# Patient Record
Sex: Male | Born: 1955
Health system: Southern US, Community
[De-identification: ages and names within clinical notes are randomized; demographics above are authoritative.]

## PROBLEM LIST (undated history)

## (undated) DIAGNOSIS — I712 Thoracic aortic aneurysm, without rupture, unspecified: Secondary | ICD-10-CM

## (undated) DIAGNOSIS — E119 Type 2 diabetes mellitus without complications: Secondary | ICD-10-CM

## (undated) DIAGNOSIS — I10 Essential (primary) hypertension: Secondary | ICD-10-CM

## (undated) HISTORY — PX: OTHER SURGICAL HISTORY: SHX169

## (undated) HISTORY — DX: Essential (primary) hypertension: I10

## (undated) HISTORY — DX: Thoracic aortic aneurysm, without rupture, unspecified: I71.20

---

## 2016-02-23 ENCOUNTER — Encounter (HOSPITAL_COMMUNITY): Payer: Self-pay | Admitting: Emergency Medicine

## 2016-02-23 ENCOUNTER — Inpatient Hospital Stay (HOSPITAL_COMMUNITY): Payer: Managed Care, Other (non HMO)

## 2016-02-23 ENCOUNTER — Inpatient Hospital Stay (HOSPITAL_COMMUNITY)
Admission: EM | Admit: 2016-02-23 | Discharge: 2016-02-26 | DRG: 854 | Disposition: A | Discharge status: Home / Self Care | Payer: Managed Care, Other (non HMO) | Attending: Internal Medicine | Admitting: Internal Medicine

## 2016-02-23 DIAGNOSIS — E860 Dehydration: Secondary | ICD-10-CM | POA: Diagnosis present

## 2016-02-23 DIAGNOSIS — N179 Acute kidney failure, unspecified: Secondary | ICD-10-CM | POA: Diagnosis present

## 2016-02-23 DIAGNOSIS — K611 Rectal abscess: Secondary | ICD-10-CM | POA: Diagnosis present

## 2016-02-23 DIAGNOSIS — G4700 Insomnia, unspecified: Secondary | ICD-10-CM | POA: Diagnosis present

## 2016-02-23 DIAGNOSIS — K648 Other hemorrhoids: Secondary | ICD-10-CM | POA: Diagnosis present

## 2016-02-23 DIAGNOSIS — E119 Type 2 diabetes mellitus without complications: Secondary | ICD-10-CM | POA: Diagnosis not present

## 2016-02-23 DIAGNOSIS — A419 Sepsis, unspecified organism: Secondary | ICD-10-CM | POA: Diagnosis present

## 2016-02-23 DIAGNOSIS — D72829 Elevated white blood cell count, unspecified: Secondary | ICD-10-CM | POA: Diagnosis present

## 2016-02-23 DIAGNOSIS — E872 Acidosis, unspecified: Secondary | ICD-10-CM | POA: Diagnosis present

## 2016-02-23 DIAGNOSIS — Z833 Family history of diabetes mellitus: Secondary | ICD-10-CM

## 2016-02-23 DIAGNOSIS — K603 Anal fistula: Secondary | ICD-10-CM | POA: Diagnosis present

## 2016-02-23 DIAGNOSIS — K612 Anorectal abscess: Secondary | ICD-10-CM | POA: Diagnosis present

## 2016-02-23 DIAGNOSIS — R509 Fever, unspecified: Secondary | ICD-10-CM | POA: Diagnosis not present

## 2016-02-23 DIAGNOSIS — R000 Tachycardia, unspecified: Secondary | ICD-10-CM | POA: Diagnosis present

## 2016-02-23 DIAGNOSIS — E1165 Type 2 diabetes mellitus with hyperglycemia: Secondary | ICD-10-CM | POA: Diagnosis present

## 2016-02-23 DIAGNOSIS — R739 Hyperglycemia, unspecified: Secondary | ICD-10-CM | POA: Diagnosis present

## 2016-02-23 HISTORY — DX: Type 2 diabetes mellitus without complications: E11.9

## 2016-02-23 LAB — CBC WITH DIFFERENTIAL/PLATELET
BASOS PCT: 0 %
Basophils Absolute: 0 10*3/uL (ref 0.0–0.1)
EOS ABS: 0 10*3/uL (ref 0.0–0.7)
EOS PCT: 0 %
HCT: 40.4 % (ref 39.0–52.0)
Hemoglobin: 13.6 g/dL (ref 13.0–17.0)
LYMPHS ABS: 1.3 10*3/uL (ref 0.7–4.0)
Lymphocytes Relative: 9 %
MCH: 23.4 pg — AB (ref 26.0–34.0)
MCHC: 33.7 g/dL (ref 30.0–36.0)
MCV: 69.5 fL — AB (ref 78.0–100.0)
MONO ABS: 1.7 10*3/uL — AB (ref 0.1–1.0)
Monocytes Relative: 12 %
NEUTROS ABS: 10.9 10*3/uL — AB (ref 1.7–7.7)
NEUTROS PCT: 79 %
PLATELETS: 256 10*3/uL (ref 150–400)
RBC: 5.81 MIL/uL (ref 4.22–5.81)
RDW: 14.5 % (ref 11.5–15.5)
WBC: 13.9 10*3/uL — ABNORMAL HIGH (ref 4.0–10.5)

## 2016-02-23 LAB — GLUCOSE, CAPILLARY: Glucose-Capillary: 359 mg/dL — ABNORMAL HIGH (ref 65–99)

## 2016-02-23 LAB — TSH: TSH: 0.761 u[IU]/mL (ref 0.350–4.500)

## 2016-02-23 LAB — COMPREHENSIVE METABOLIC PANEL
ALT: 26 U/L (ref 17–63)
ANION GAP: 16 — AB (ref 5–15)
AST: 26 U/L (ref 15–41)
Albumin: 4.1 g/dL (ref 3.5–5.0)
Alkaline Phosphatase: 61 U/L (ref 38–126)
BUN: 13 mg/dL (ref 6–20)
CHLORIDE: 97 mmol/L — AB (ref 101–111)
CO2: 23 mmol/L (ref 22–32)
CREATININE: 1.33 mg/dL — AB (ref 0.61–1.24)
Calcium: 9.4 mg/dL (ref 8.9–10.3)
GFR, EST NON AFRICAN AMERICAN: 57 mL/min — AB (ref >60)
Glucose, Bld: 288 mg/dL — ABNORMAL HIGH (ref 65–99)
POTASSIUM: 4.4 mmol/L (ref 3.5–5.1)
SODIUM: 136 mmol/L (ref 135–145)
Total Bilirubin: 0.9 mg/dL (ref 0.3–1.2)
Total Protein: 8.6 g/dL — ABNORMAL HIGH (ref 6.5–8.1)

## 2016-02-23 LAB — LACTIC ACID, PLASMA: Lactic Acid, Venous: 1.3 mmol/L (ref 0.5–1.9)

## 2016-02-23 LAB — I-STAT CG4 LACTIC ACID, ED: LACTIC ACID, VENOUS: 3.42 mmol/L — AB (ref 0.5–1.9)

## 2016-02-23 LAB — CBG MONITORING, ED
Glucose-Capillary: 344 mg/dL — ABNORMAL HIGH (ref 65–99)
Glucose-Capillary: 244 mg/dL — ABNORMAL HIGH (ref 65–99)

## 2016-02-23 MED ORDER — MORPHINE SULFATE (PF) 4 MG/ML IV SOLN
4.0000 mg | Freq: Once | INTRAVENOUS | Status: AC
  Administered 2016-02-23: 4 mg via INTRAVENOUS
  Filled 2016-02-23: qty 1

## 2016-02-23 MED ORDER — SODIUM CHLORIDE 0.9 % IJ SOLN
INTRAMUSCULAR | Status: AC
  Filled 2016-02-23: qty 50

## 2016-02-23 MED ORDER — ONDANSETRON HCL 4 MG/2ML IJ SOLN
4.0000 mg | Freq: Four times a day (QID) | INTRAMUSCULAR | Status: DC | PRN
  Administered 2016-02-24: 4 mg via INTRAVENOUS

## 2016-02-23 MED ORDER — ONDANSETRON HCL 4 MG PO TABS: 4.0000 mg | ORAL_TABLET | Freq: Four times a day (QID) | ORAL | Status: DC | PRN

## 2016-02-23 MED ORDER — INSULIN GLARGINE 100 UNIT/ML ~~LOC~~ SOLN
15.0000 [IU] | Freq: Every day | SUBCUTANEOUS | Status: DC
  Administered 2016-02-23 – 2016-02-24 (×2): 15 [IU] via SUBCUTANEOUS
  Filled 2016-02-23 (×3): qty 0.15

## 2016-02-23 MED ORDER — IOPAMIDOL (ISOVUE-300) INJECTION 61%
INTRAVENOUS | Status: AC
  Filled 2016-02-23: qty 100

## 2016-02-23 MED ORDER — SENNOSIDES-DOCUSATE SODIUM 8.6-50 MG PO TABS
1.0000 | ORAL_TABLET | Freq: Two times a day (BID) | ORAL | Status: DC
  Administered 2016-02-23 – 2016-02-24 (×2): 1 via ORAL
  Filled 2016-02-23 (×6): qty 1

## 2016-02-23 MED ORDER — ACETAMINOPHEN 650 MG RE SUPP: 650.0000 mg | Freq: Four times a day (QID) | RECTAL | Status: DC | PRN

## 2016-02-23 MED ORDER — SODIUM CHLORIDE 0.9 % IV BOLUS (SEPSIS)
1000.0000 mL | Freq: Once | INTRAVENOUS | Status: AC
  Administered 2016-02-23: 1000 mL via INTRAVENOUS

## 2016-02-23 MED ORDER — ACETAMINOPHEN 325 MG PO TABS
650.0000 mg | ORAL_TABLET | Freq: Four times a day (QID) | ORAL | Status: DC | PRN
  Administered 2016-02-25: 650 mg via ORAL
  Filled 2016-02-23: qty 2

## 2016-02-23 MED ORDER — LIVING WELL WITH DIABETES BOOK
Freq: Once | Status: DC
  Filled 2016-02-23: qty 1

## 2016-02-23 MED ORDER — LIDOCAINE HCL 2 % IJ SOLN
20.0000 mL | Freq: Once | INTRAMUSCULAR | Status: AC
  Administered 2016-02-23: 400 mg via INTRADERMAL
  Filled 2016-02-23: qty 20

## 2016-02-23 MED ORDER — INSULIN ASPART 100 UNIT/ML ~~LOC~~ SOLN
4.0000 [IU] | Freq: Three times a day (TID) | SUBCUTANEOUS | Status: DC
  Administered 2016-02-24 – 2016-02-26 (×7): 4 [IU] via SUBCUTANEOUS

## 2016-02-23 MED ORDER — HYDROCODONE-ACETAMINOPHEN 5-325 MG PO TABS
1.0000 | ORAL_TABLET | ORAL | Status: DC | PRN
  Administered 2016-02-23 – 2016-02-24 (×2): 2 via ORAL
  Filled 2016-02-23 (×2): qty 2

## 2016-02-23 MED ORDER — INSULIN ASPART 100 UNIT/ML ~~LOC~~ SOLN
0.0000 [IU] | Freq: Every day | SUBCUTANEOUS | Status: DC
  Administered 2016-02-23: 5 [IU] via SUBCUTANEOUS
  Administered 2016-02-24: 2 [IU] via SUBCUTANEOUS

## 2016-02-23 MED ORDER — PIPERACILLIN-TAZOBACTAM 3.375 G IVPB
3.3750 g | Freq: Three times a day (TID) | INTRAVENOUS | Status: DC
  Administered 2016-02-24 – 2016-02-26 (×8): 3.375 g via INTRAVENOUS
  Filled 2016-02-23 (×7): qty 50

## 2016-02-23 MED ORDER — HEPARIN SODIUM (PORCINE) 5000 UNIT/ML IJ SOLN
5000.0000 [IU] | Freq: Three times a day (TID) | INTRAMUSCULAR | Status: DC
  Administered 2016-02-23 – 2016-02-26 (×6): 5000 [IU] via SUBCUTANEOUS
  Filled 2016-02-23 (×6): qty 1

## 2016-02-23 MED ORDER — PIPERACILLIN-TAZOBACTAM 3.375 G IVPB
3.3750 g | Freq: Once | INTRAVENOUS | Status: AC
  Administered 2016-02-23: 3.375 g via INTRAVENOUS
  Filled 2016-02-23: qty 50

## 2016-02-23 MED ORDER — ZOLPIDEM TARTRATE 5 MG PO TABS
5.0000 mg | ORAL_TABLET | Freq: Every evening | ORAL | Status: DC | PRN
Start: 2016-02-23 — End: 2016-02-26

## 2016-02-23 MED ORDER — POTASSIUM CHLORIDE IN NACL 20-0.9 MEQ/L-% IV SOLN
INTRAVENOUS | Status: DC
  Administered 2016-02-23 – 2016-02-24 (×2): via INTRAVENOUS
  Filled 2016-02-23 (×3): qty 1000

## 2016-02-23 MED ORDER — IOPAMIDOL (ISOVUE-300) INJECTION 61%
100.0000 mL | Freq: Once | INTRAVENOUS | Status: AC | PRN
Start: 2016-02-23 — End: 2016-02-23
  Administered 2016-02-23: 100 mL via INTRAVENOUS

## 2016-02-23 MED ORDER — INSULIN ASPART 100 UNIT/ML ~~LOC~~ SOLN
0.0000 [IU] | Freq: Three times a day (TID) | SUBCUTANEOUS | Status: DC
  Administered 2016-02-24: 5 [IU] via SUBCUTANEOUS
  Administered 2016-02-24 – 2016-02-26 (×6): 3 [IU] via SUBCUTANEOUS

## 2016-02-23 NOTE — ED Triage Notes (Signed)
Pt was seen at UC this am and sent for further evaluation to diagnose perirectal abscess. Pt c/o swelling and pain to rectal area. Pt was also told his blood sugar was high, CBG of 291.

## 2016-02-23 NOTE — ED Provider Notes (Signed)
[facility]-EMERGENCY DEPT Provider Note   CSN: [id number] Arrival date & time: 02/23/16  1230     History   Chief Complaint Chief Complaint  Patient presents with  . Abscess  . Hyperglycemia    HPI Paul Nichols is a 61 y.o. male.  The history is provided by the patient. No language interpreter was used.  Abscess  Hyperglycemia    Paul Nichols is a 61 y.o. male who presents to the Emergency Department complaining of abscess.  Paul Nichols presents from urgent care for evaluation of rectal pain and hyperglycemia. He reports 1 week of URI symptoms with cough, runny nose, nasal congestion. He was starting to improve from those symptoms when he began to feel poorly over the last several days with malaise, body aches and rectal pain. He went to urgent care today and was noted to be hyperglycemic and was referred to the emergency department for further evaluation. He denies any vomiting, diarrhea, constipation, abdominal pain, dysuria. He has no known history of diabetes and last ate yesterday. He had a fever to 101 today at urgent care.  History reviewed. No pertinent past medical history.  There are no active problems to display for this patient.   History reviewed. No pertinent surgical history.     Home Medications    Prior to Admission medications   Medication Sig Start Date End Date Taking? Authorizing Provider  Pseudoeph-Doxylamine-DM-APAP (NYQUIL PO) Take 30 mLs by mouth at bedtime as needed (cold symptoms).   Yes Historical Provider, MD    Family History No family history on file.  Social History Social History  Substance Use Topics  . Smoking status: Never Smoker  . Smokeless tobacco: Not on file  . Alcohol use Yes     Comment: Social     Allergies   Patient has no known allergies.   Review of Systems Review of Systems  All other systems reviewed and are negative.    Physical Exam Updated Vital Signs BP 120/77 (BP Location: Left Arm)   Pulse 114   Temp  100.2 F (37.9 C) (Oral)   Resp 16   Ht 5\' 10"  (1.778 m)   Wt 211 lb 6.4 oz (95.9 kg)   SpO2 98%   BMI 30.33 kg/m   Physical Exam  Constitutional: He is oriented to person, place, and time. He appears well-developed and well-nourished.  HENT:  Head: Normocephalic and atraumatic.  Cardiovascular: Regular rhythm.   No murmur heard. Tachycardic  Pulmonary/Chest: Effort normal and breath sounds normal. No respiratory distress.  Abdominal: Soft. There is no tenderness. There is no rebound and no guarding.  Genitourinary:  Genitourinary Comments: Approximately 1 cm area of tenderness and induration in the right perirectal region.  Musculoskeletal: He exhibits no edema or tenderness.  Neurological: He is alert and oriented to person, place, and time.  Skin: Skin is warm and dry.  Psychiatric: He has a normal mood and affect. His behavior is normal.  Nursing note and vitals reviewed.    ED Treatments / Results  Labs (all labs ordered are listed, but only abnormal results are displayed) Labs Reviewed  CBC WITH DIFFERENTIAL/PLATELET - Abnormal; Notable for the following:       Result Value   WBC 13.9 (*)    MCV 69.5 (*)    MCH 23.4 (*)    Neutro Abs 10.9 (*)    Monocytes Absolute 1.7 (*)    All other components within normal limits  COMPREHENSIVE METABOLIC PANEL - Abnormal; Notable for  the following:    Chloride 97 (*)    Glucose, Bld 288 (*)    Creatinine, Ser 1.33 (*)    Total Protein 8.6 (*)    GFR calc non Af Amer 57 (*)    Anion gap 16 (*)    All other components within normal limits  CBG MONITORING, ED - Abnormal; Notable for the following:    Glucose-Capillary 344 (*)    All other components within normal limits  I-STAT CG4 LACTIC ACID, ED - Abnormal; Notable for the following:    Lactic Acid, Venous 3.42 (*)    All other components within normal limits  URINALYSIS, ROUTINE W REFLEX MICROSCOPIC    EKG  EKG Interpretation None       Radiology No results  found.  Procedures Procedures (including critical care time)  Medications Ordered in ED Medications  sodium chloride 0.9 % bolus 1,000 mL (not administered)  morphine 4 MG/ML injection 4 mg (4 mg Intravenous Given 02/23/16 1621)  sodium chloride 0.9 % bolus 1,000 mL (1,000 mLs Intravenous New Bag/Given 02/23/16 1621)     Initial Impression / Assessment and Plan / ED Course  I have reviewed the triage vital signs and the nursing notes.  Pertinent labs & imaging results that were available during my care of the patient were reviewed by me and considered in my medical decision making (see chart for details).     Patient here with perirectal pain and fever. He has a small perirectal abscess on examination. Gen. surgery consulted for further management. Patient updated of findings and studies.  Surgery has evaluated the patient in the ED - plan for bedside I and D.  Will start on abx.  Hospitalist consulted for admission.    Final Clinical Impressions(s) / ED Diagnoses   Final diagnoses:  Perirectal abscess    New Prescriptions New Prescriptions   No medications on file     Tilden FossaElizabeth Kadija Cruzen, MD 02/24/16 [phone]

## 2016-02-23 NOTE — Progress Notes (Signed)
Pharmacy Antibiotic Note  Paul Nichols is a 61 y.o. male admitted on 02/23/2016 with sepsis and perirectal abscess.  Pharmacy has been consulted for zosyn dosing.  Plan: Zosyn 3.375g IV q8h (4 hour infusion time).   Height: 5\' 10"  (177.8 cm) Weight: 211 lb (95.7 kg) IBW/kg (Calculated) : 73  Temp (24hrs), Avg:100 F (37.8 C), Min:99.7 F (37.6 C), Max:100.2 F (37.9 C)   Recent Labs Lab 02/23/16 1602 02/23/16 1613  WBC 13.9*  --   CREATININE 1.33*  --   LATICACIDVEN  --  3.42*    Estimated Creatinine Clearance: 70.3 mL/min (by C-G formula based on SCr of 1.33 mg/dL (H)).    No Known Allergies  Antimicrobials this admission: 2/15 Zosyn >>   Dose adjustments this admission:  Microbiology results: 2/15 BCx: sent 2/15 UCx: sent   Thank you for allowing pharmacy to be a part of this patient's care.  Clance BollRunyon, Jiovani Mccammon 02/23/2016 6:07 PM

## 2016-02-23 NOTE — Consult Note (Signed)
Reason for Consult:  Perirectal abscess with elevated glucose Referring Physician: Dr. Rudean Haskell NO PCP  Paul Nichols is an 61 y.o. male.  HPI: Pt presented to an Urgent Care this AM with rectal pain.  Found to have a fever 101, and elevated glucose.  He note symptoms started about 2 day ago.  No history of diabetes, no meds at home before this.  Mother and father have diabetes  History reviewed. No pertinent past medical history.  History reviewed. No pertinent surgical history.   Right thumb surgery, torn ligament  No family history on file.  Social History:  reports that he has never smoked. He does not have any smokeless tobacco history on file. He reports that he drinks alcohol. His drug history is not on file.  Allergies: No Known Allergies  Prior to Admission medications   none     Results for orders placed or performed during the hospital encounter of 02/23/16 (from the past 48 hour(s))  CBG monitoring, ED     Status: Abnormal   Collection Time: 02/23/16  1:05 PM  Result Value Ref Range   Glucose-Capillary 344 (H) 65 - 99 mg/dL  CBC with Differential     Status: Abnormal   Collection Time: 02/23/16  4:02 PM  Result Value Ref Range   WBC 13.9 (H) 4.0 - 10.5 K/uL   RBC 5.81 4.22 - 5.81 MIL/uL   Hemoglobin 13.6 13.0 - 17.0 g/dL   HCT 40.4 39.0 - 52.0 %   MCV 69.5 (L) 78.0 - 100.0 fL   MCH 23.4 (L) 26.0 - 34.0 pg   MCHC 33.7 30.0 - 36.0 g/dL   RDW 14.5 11.5 - 15.5 %   Platelets 256 150 - 400 K/uL   Neutrophils Relative % 79 %   Lymphocytes Relative 9 %   Monocytes Relative 12 %   Eosinophils Relative 0 %   Basophils Relative 0 %   Neutro Abs 10.9 (H) 1.7 - 7.7 K/uL   Lymphs Abs 1.3 0.7 - 4.0 K/uL   Monocytes Absolute 1.7 (H) 0.1 - 1.0 K/uL   Eosinophils Absolute 0.0 0.0 - 0.7 K/uL   Basophils Absolute 0.0 0.0 - 0.1 K/uL   Smear Review LARGE PLATELETS PRESENT   Comprehensive metabolic panel     Status: Abnormal   Collection Time: 02/23/16  4:02 PM  Result Value  Ref Range   Sodium 136 135 - 145 mmol/L   Potassium 4.4 3.5 - 5.1 mmol/L   Chloride 97 (L) 101 - 111 mmol/L   CO2 23 22 - 32 mmol/L   Glucose, Bld 288 (H) 65 - 99 mg/dL   BUN 13 6 - 20 mg/dL   Creatinine, Ser 1.33 (H) 0.61 - 1.24 mg/dL   Calcium 9.4 8.9 - 10.3 mg/dL   Total Protein 8.6 (H) 6.5 - 8.1 g/dL   Albumin 4.1 3.5 - 5.0 g/dL   AST 26 15 - 41 U/L   ALT 26 17 - 63 U/L   Alkaline Phosphatase 61 38 - 126 U/L   Total Bilirubin 0.9 0.3 - 1.2 mg/dL   GFR calc non Af Amer 57 (L) >60 mL/min   GFR calc Af Amer >60 >60 mL/min    Comment: (NOTE) The eGFR has been calculated using the CKD EPI equation. This calculation has not been validated in all clinical situations. eGFR's persistently <60 mL/min signify possible Chronic Kidney Disease.    Anion gap 16 (H) 5 - 15  I-Stat CG4 Lactic Acid, ED  Status: Abnormal   Collection Time: 02/23/16  4:13 PM  Result Value Ref Range   Lactic Acid, Venous 3.42 (HH) 0.5 - 1.9 mmol/L   Comment NOTIFIED PHYSICIAN     No results found.  Review of Systems  Constitutional: Positive for chills and fever (fever at home and currently 102). Negative for weight loss.  HENT: Negative.   Eyes: Negative.   Respiratory: Negative.   Gastrointestinal: Negative.   Genitourinary: Negative.   Musculoskeletal: Negative.   Skin: Negative.   Neurological: Negative.   Endo/Heme/Allergies: Negative.   Psychiatric/Behavioral: Negative.    Blood pressure 120/77, pulse 114, temperature 100.2 F (37.9 C), temperature source Oral, resp. rate 16, height _0  (1.778 m), weight 95.9 kg (211 lb 6.4 oz), SpO2 98 %. Physical Exam  Constitutional: He is oriented to person, place, and time. He appears well-developed and well-nourished. He appears distressed.  HENT:  Head: Normocephalic and atraumatic.  Mouth/Throat: Oropharyngeal exudate present.  Eyes: Right eye exhibits discharge. Left eye exhibits discharge.  Neck: No JVD present. No tracheal deviation  present. No thyromegaly present.  Cardiovascular: Normal rate, regular rhythm, normal heart sounds and intact distal pulses.   No murmur heard. Respiratory: Effort normal and breath sounds normal. No respiratory distress. He has no wheezes. He has no rales. He exhibits no tenderness.  GI: Soft. Bowel sounds are normal. He exhibits no distension and no mass. There is no tenderness. There is no rebound and no guarding.  Genitourinary:  Genitourinary Comments:  Tenderness and pain Left upper  position of rectum around the anus  Musculoskeletal: He exhibits no edema or tenderness.  Lymphadenopathy:    He has no cervical adenopathy.  Neurological: He is alert and oriented to person, place, and time. No cranial nerve deficit.  Skin: Skin is warm and dry. No rash noted. He is not diaphoretic. There is erythema. No pallor.  Psychiatric: He has a normal mood and affect. His behavior is normal. Judgment and thought content normal.    Assessment/Plan: Perirectal abscess Sepsis Probable diabetes Lactic acidosis  Plan:  I&D in ED and Medical admit for sepsis and new Diabetes.We did not find anything to explain his fever and pain.  Will also add a CT scan tonight.  Recommend we start Zosyn.    [name] 02/23/2016, 5:10 PM

## 2016-02-23 NOTE — H&P (Signed)
History and Physical  Paul Nichols ZOX:[id number] DOB: [birth year] DOA: 02/23/2016  Referring physician: Madilyn Hookees, MD PCP: No PCP Per Patient   Chief Complaint: abscess  HPI: Paul Nichols is a 61 y.o. male with no known PMH presented to the Emergency Department complaining of abscess.  He was sent from a local urgent care facility with fever.   Paul Nichols presented from urgent care for evaluation of rectal pain and hyperglycemia.  His blood glucose was noted to be greater than 300.   He reports 1 week of URI symptoms with cough, runny nose, nasal congestion.  He also reports polyuria and polydipsia in past 24 hours.  His wife says that he has been sweating a lot over past several days and he has been having fever and chills.  He was starting to improve from those symptoms when he began to feel poorly over the last several days with malaise, body aches and rectal pain. He went to urgent care today and was noted to be hyperglycemic and was referred to the emergency department for further evaluation. He denies any vomiting, diarrhea, constipation, abdominal pain, dysuria. He has no known history of diabetes and last ate yesterday. He had a fever to 101 today at urgent care.  He was seen in ED and noted to be tachycardic with fever and blood sugar of 300.  He had a large peri-rectal abscess that was I&D'd in ED by general surgery.  He had sepsis physiology.  He was noted to have an elevated lactic acid and also noted to have leukocytosis.  CODE SEPSIS was called and admission was requested for further management.     Review of Systems: All systems reviewed and apart from history of presenting illness, are negative.  Past Medical History:  Diagnosis Date  . Diabetes mellitus without complication [facility](HCC)    Past Surgical History:  Procedure Laterality Date  . thumb surgery     in high school , hx injury playing football   Social History:  reports that he has never smoked. He has never used smokeless  tobacco. He reports that he drinks alcohol. He reports that he does not use drugs.  No Known Allergies  Family History  Problem Relation Age of Onset  . Diabetes Mother   . Diabetes Father      Prior to Admission medications   Medication Sig Start Date End Date Taking? Authorizing Provider  Pseudoeph-Doxylamine-DM-APAP (NYQUIL PO) Take 30 mLs by mouth at bedtime as needed (cold symptoms).   Yes Historical Provider, MD   Physical Exam: Vitals:   02/23/16 1235 02/23/16 1505 02/23/16 1621 02/23/16 1745  BP: 133/97 120/77    Pulse: 113 119 114   Resp: 16 16    Temp: 99.7 F (37.6 C) 100.2 F (37.9 C)    TempSrc: Oral Oral    SpO2: 97% 100% 98%   Weight: 95.9 kg (211 lb 6.4 oz)   95.7 kg (211 lb)  Height: 5\' 10"  (1.778 m)   5\' 10"  (1.778 m)     General exam: Moderately built and nourished patient, lying comfortably supine on the gurney in no obvious distress.  Head, eyes and ENT: Nontraumatic and normocephalic. Pupils equally reacting to light and accommodation. Oral mucosa very dry.   Neck: Supple. No JVD, carotid bruit or thyromegaly.   Lymphatics: No lymphadenopathy.  Respiratory system: Clear to auscultation. No increased work of breathing.  Cardiovascular system: S1 and S2 heard, tachycardic. No JVD, murmurs, gallops, clicks or pedal edema.  Gastrointestinal system: Abdomen is nondistended, soft and nontender. Normal bowel sounds heard. No organomegaly or masses appreciated.  GU: Tenderness and pain Left upper  position of rectum around the anus.   Central nervous system: Alert and oriented. No focal neurological deficits.  Extremities: Symmetric 5 x 5 power. Peripheral pulses symmetrically felt.   Skin: No rashes or acute findings.  Musculoskeletal system: Negative exam.  Psychiatry: Pleasant and cooperative.   Labs on Admission:  Basic Metabolic Panel:  Recent Labs Lab 02/23/16 1602  NA 136  K 4.4  CL 97*  CO2 23  GLUCOSE 288*  BUN 13  CREATININE  1.33*  CALCIUM 9.4   Liver Function Tests:  Recent Labs Lab 02/23/16 1602  AST 26  ALT 26  ALKPHOS 61  BILITOT 0.9  PROT 8.6*  ALBUMIN 4.1   No results for input(s): LIPASE, AMYLASE in the last 168 hours. No results for input(s): AMMONIA in the last 168 hours. CBC:  Recent Labs Lab 02/23/16 1602  WBC 13.9*  NEUTROABS 10.9*  HGB 13.6  HCT 40.4  MCV 69.5*  PLT 256   Cardiac Enzymes: No results for input(s): CKTOTAL, CKMB, CKMBINDEX, TROPONINI in the last 168 hours.  BNP (last 3 results) No results for input(s): PROBNP in the last 8760 hours. CBG:  Recent Labs Lab 02/23/16 1305  GLUCAP 344*    Radiological Exams on Admission: No results found.  EKG: Independently reviewed. Sinus tachycardia  Assessment/Plan Active Problems:   Sepsis (HCC)   Type 2 diabetes mellitus (HCC)   Hyperglycemia   Dehydration   AKI (acute kidney injury) (HCC)   Fever   Perirectal abscess   Leukocytosis   Lactic acid acidosis   Sinus tachycardia   1. Sepsis secondary to peri-rectal abscess - Continue Code Sepsis, IVF hydration after initial boluses, Follow lactate level, repeat within 6 hours, follow blood cultures, check urinalysis, continue IV zosyn per pharmacy, I&D of abscess (needle aspiration) done in ED by general surgery. They are getting a CT scan to further investigate causes for fever and sepsis.   2. New diagnosis of type 2 DM - check A1c, Lantus and novolog ordered, diabetes education to be started, consult DM coordinators.  Sliding scale coverage ordered and check CBG 5 times per day.  Carb modified diet.  Needs to watch diabetes videos.  Pt needs to get established with a PCP.  His wife says she will try to get him in to her clinic with [facility].  Consult care manager to assist with primary care provider.   3. Leukocytosis - secondary to sepsis and infection, treating as above.  4. Lactic Acidosis - Aggressive fluid hydration per Code Sepsis  protocol, follow lactate levels, repeat ordered within 6 hours.  5. Sinus tachycardia - secondary to fever, sepsis, dehydration, infection - treating as above.  6. Acute Kidney Injury - suspect prerenal secondary to dehydration, IVF hydration as ordered and repeat BMP in AM.   7. Fever - tylenol ordered as needed.     DVT Prophylaxis: heparin Code Status: full   Family Communication: wife  Disposition Plan: home    Time spent: 40 mins  Standley Dakins, MD [facility] Pager [phone]  If 7PM-7AM, please contact night-coverage [email or web address] Password [facility] 02/23/2016, 5:56 PM

## 2016-02-24 ENCOUNTER — Inpatient Hospital Stay (HOSPITAL_COMMUNITY): Payer: Managed Care, Other (non HMO) | Admitting: Anesthesiology

## 2016-02-24 ENCOUNTER — Encounter (HOSPITAL_COMMUNITY)
Admission: EM | Disposition: A | Discharge status: Home / Self Care | Payer: Self-pay | Source: Home / Self Care | Attending: Internal Medicine

## 2016-02-24 ENCOUNTER — Encounter (HOSPITAL_COMMUNITY): Payer: Self-pay | Admitting: Anesthesiology

## 2016-02-24 DIAGNOSIS — N179 Acute kidney failure, unspecified: Secondary | ICD-10-CM

## 2016-02-24 DIAGNOSIS — K611 Rectal abscess: Secondary | ICD-10-CM

## 2016-02-24 DIAGNOSIS — E119 Type 2 diabetes mellitus without complications: Secondary | ICD-10-CM

## 2016-02-24 DIAGNOSIS — A419 Sepsis, unspecified organism: Principal | ICD-10-CM

## 2016-02-24 HISTORY — PX: INCISION AND DRAINAGE PERIRECTAL ABSCESS: SHX1804

## 2016-02-24 LAB — URINALYSIS, ROUTINE W REFLEX MICROSCOPIC
Bacteria, UA: NONE SEEN
Bilirubin Urine: NEGATIVE
HGB URINE DIPSTICK: NEGATIVE
KETONES UR: 20 mg/dL — AB
LEUKOCYTES UA: NEGATIVE
NITRITE: NEGATIVE
PH: 5 (ref 5.0–8.0)
Protein, ur: 30 mg/dL — AB
Specific Gravity, Urine: 1.032 — ABNORMAL HIGH (ref 1.005–1.030)
Squamous Epithelial / LPF: NONE SEEN

## 2016-02-24 LAB — BLOOD CULTURE ID PANEL (REFLEXED)
ACINETOBACTER BAUMANNII: NOT DETECTED
CANDIDA ALBICANS: NOT DETECTED
CANDIDA GLABRATA: NOT DETECTED
CANDIDA KRUSEI: NOT DETECTED
CANDIDA PARAPSILOSIS: NOT DETECTED
CANDIDA TROPICALIS: NOT DETECTED
ENTEROBACTERIACEAE SPECIES: NOT DETECTED
ESCHERICHIA COLI: NOT DETECTED
Enterobacter cloacae complex: NOT DETECTED
Enterococcus species: NOT DETECTED
HAEMOPHILUS INFLUENZAE: NOT DETECTED
KLEBSIELLA OXYTOCA: NOT DETECTED
KLEBSIELLA PNEUMONIAE: NOT DETECTED
Listeria monocytogenes: NOT DETECTED
Methicillin resistance: NOT DETECTED
Neisseria meningitidis: NOT DETECTED
PSEUDOMONAS AERUGINOSA: NOT DETECTED
Proteus species: NOT DETECTED
STREPTOCOCCUS PNEUMONIAE: NOT DETECTED
STREPTOCOCCUS PYOGENES: NOT DETECTED
Serratia marcescens: NOT DETECTED
Staphylococcus aureus (BCID): NOT DETECTED
Staphylococcus species: DETECTED — AB
Streptococcus agalactiae: NOT DETECTED
Streptococcus species: DETECTED — AB

## 2016-02-24 LAB — COMPREHENSIVE METABOLIC PANEL
ALK PHOS: 52 U/L (ref 38–126)
ALT: 22 U/L (ref 17–63)
ANION GAP: 8 (ref 5–15)
AST: 16 U/L (ref 15–41)
Albumin: 3.3 g/dL — ABNORMAL LOW (ref 3.5–5.0)
BILIRUBIN TOTAL: 0.9 mg/dL (ref 0.3–1.2)
BUN: 11 mg/dL (ref 6–20)
CO2: 24 mmol/L (ref 22–32)
CREATININE: 1.02 mg/dL (ref 0.61–1.24)
Calcium: 8.7 mg/dL — ABNORMAL LOW (ref 8.9–10.3)
Chloride: 105 mmol/L (ref 101–111)
Glucose, Bld: 244 mg/dL — ABNORMAL HIGH (ref 65–99)
Potassium: 3.8 mmol/L (ref 3.5–5.1)
Sodium: 137 mmol/L (ref 135–145)
Total Protein: 7.3 g/dL (ref 6.5–8.1)

## 2016-02-24 LAB — CBC WITH DIFFERENTIAL/PLATELET
BASOS PCT: 0 %
Basophils Absolute: 0 10*3/uL (ref 0.0–0.1)
Eosinophils Absolute: 0 10*3/uL (ref 0.0–0.7)
Eosinophils Relative: 0 %
HEMATOCRIT: 36.8 % — AB (ref 39.0–52.0)
Hemoglobin: 12.3 g/dL — ABNORMAL LOW (ref 13.0–17.0)
LYMPHS ABS: 1.7 10*3/uL (ref 0.7–4.0)
Lymphocytes Relative: 12 %
MCH: 23.3 pg — AB (ref 26.0–34.0)
MCHC: 33.4 g/dL (ref 30.0–36.0)
MCV: 69.8 fL — AB (ref 78.0–100.0)
MONOS PCT: 11 %
Monocytes Absolute: 1.6 10*3/uL — ABNORMAL HIGH (ref 0.1–1.0)
Neutro Abs: 11.1 10*3/uL — ABNORMAL HIGH (ref 1.7–7.7)
Neutrophils Relative %: 77 %
PLATELETS: 243 10*3/uL (ref 150–400)
RBC: 5.27 MIL/uL (ref 4.22–5.81)
RDW: 14.4 % (ref 11.5–15.5)
WBC: 14.4 10*3/uL — AB (ref 4.0–10.5)

## 2016-02-24 LAB — GLUCOSE, CAPILLARY
GLUCOSE-CAPILLARY: 247 mg/dL — AB (ref 65–99)
GLUCOSE-CAPILLARY: 168 mg/dL — AB (ref 65–99)
GLUCOSE-CAPILLARY: 231 mg/dL — AB (ref 65–99)
Glucose-Capillary: 244 mg/dL — ABNORMAL HIGH (ref 65–99)
Glucose-Capillary: 147 mg/dL — ABNORMAL HIGH (ref 65–99)
Glucose-Capillary: 187 mg/dL — ABNORMAL HIGH (ref 65–99)

## 2016-02-24 LAB — MRSA PCR SCREENING: MRSA BY PCR: NEGATIVE

## 2016-02-24 SURGERY — INCISION AND DRAINAGE, ABSCESS, PERIRECTAL
Anesthesia: General | Site: Perineum

## 2016-02-24 MED ORDER — MIDAZOLAM HCL 5 MG/5ML IJ SOLN
INTRAMUSCULAR | Status: DC | PRN
  Administered 2016-02-24: 2 mg via INTRAVENOUS

## 2016-02-24 MED ORDER — MUPIROCIN 2 % EX OINT
TOPICAL_OINTMENT | CUTANEOUS | Status: AC
  Administered 2016-02-24: 11:00:00
  Filled 2016-02-24: qty 22

## 2016-02-24 MED ORDER — FENTANYL CITRATE (PF) 100 MCG/2ML IJ SOLN: 25.0000 ug | INTRAMUSCULAR | Status: DC | PRN

## 2016-02-24 MED ORDER — BUPIVACAINE HCL (PF) 0.5 % IJ SOLN
INTRAMUSCULAR | Status: AC
  Filled 2016-02-24: qty 30

## 2016-02-24 MED ORDER — ACETAMINOPHEN 160 MG/5ML PO SOLN
1000.0000 mg | Freq: Once | ORAL | Status: AC
  Administered 2016-02-24: 350 mg via ORAL
  Administered 2016-02-24: 649.6 mg via ORAL

## 2016-02-24 MED ORDER — FENTANYL CITRATE (PF) 100 MCG/2ML IJ SOLN
INTRAMUSCULAR | Status: DC | PRN
  Administered 2016-02-24 (×7): 50 ug via INTRAVENOUS

## 2016-02-24 MED ORDER — HYDROMORPHONE HCL 1 MG/ML IJ SOLN
INTRAMUSCULAR | Status: AC
  Filled 2016-02-24: qty 1

## 2016-02-24 MED ORDER — LACTATED RINGERS IV SOLN
INTRAVENOUS | Status: DC | PRN
  Administered 2016-02-24: 12:00:00 via INTRAVENOUS

## 2016-02-24 MED ORDER — FENTANYL CITRATE (PF) 250 MCG/5ML IJ SOLN
INTRAMUSCULAR | Status: AC
  Filled 2016-02-24: qty 5

## 2016-02-24 MED ORDER — MIDAZOLAM HCL 2 MG/2ML IJ SOLN
INTRAMUSCULAR | Status: AC
  Filled 2016-02-24: qty 2

## 2016-02-24 MED ORDER — LIDOCAINE HCL (CARDIAC) 20 MG/ML IV SOLN
INTRAVENOUS | Status: DC | PRN
  Administered 2016-02-24: 100 mg via INTRAVENOUS

## 2016-02-24 MED ORDER — SODIUM CHLORIDE 0.45 % IV SOLN
INTRAVENOUS | Status: DC
  Administered 2016-02-24: 15:00:00 via INTRAVENOUS

## 2016-02-24 MED ORDER — SODIUM CHLORIDE 0.9 % IR SOLN
Status: DC | PRN
  Administered 2016-02-24: 1000 mL

## 2016-02-24 MED ORDER — FENTANYL CITRATE (PF) 100 MCG/2ML IJ SOLN
INTRAMUSCULAR | Status: AC
  Filled 2016-02-24: qty 2

## 2016-02-24 MED ORDER — PROPOFOL 10 MG/ML IV BOLUS
INTRAVENOUS | Status: AC
  Filled 2016-02-24: qty 20

## 2016-02-24 MED ORDER — PROPOFOL 10 MG/ML IV BOLUS
INTRAVENOUS | Status: DC | PRN
  Administered 2016-02-24: 200 mg via INTRAVENOUS

## 2016-02-24 MED ORDER — LIVING WELL WITH DIABETES BOOK
Freq: Once | Status: AC
  Administered 2016-02-24: 14:00:00
  Filled 2016-02-24: qty 1

## 2016-02-24 MED ORDER — HYDROMORPHONE HCL 1 MG/ML IJ SOLN
0.2500 mg | INTRAMUSCULAR | Status: DC | PRN
  Administered 2016-02-24 (×4): 0.5 mg via INTRAVENOUS

## 2016-02-24 MED ORDER — BUPIVACAINE LIPOSOME 1.3 % IJ SUSP
20.0000 mL | Freq: Once | INTRAMUSCULAR | Status: AC
  Administered 2016-02-24: 20 mL
  Filled 2016-02-24: qty 20

## 2016-02-24 MED ORDER — ACETAMINOPHEN 160 MG/5ML PO SOLN
ORAL | Status: AC
  Filled 2016-02-24: qty 40.6

## 2016-02-24 SURGICAL SUPPLY — 35 items
BLADE HEX COATED 2.75 (ELECTRODE) ×3 IMPLANT
BLADE SURG 15 STRL LF DISP TIS (BLADE) ×1 IMPLANT
BLADE SURG 15 STRL SS (BLADE) ×2
COVER SURGICAL LIGHT HANDLE (MISCELLANEOUS) ×3 IMPLANT
DRAIN PENROSE 18X1/2 LTX STRL (DRAIN) IMPLANT
DRAIN PENROSE 18X1/4 LTX STRL (WOUND CARE) IMPLANT
DRAPE LAPAROTOMY T 102X78X121 (DRAPES) ×3 IMPLANT
DRSG PAD ABDOMINAL 8X10 ST (GAUZE/BANDAGES/DRESSINGS) ×3 IMPLANT
ELECT PENCIL ROCKER SW 15FT (MISCELLANEOUS) ×3 IMPLANT
ELECT REM PT RETURN 9FT ADLT (ELECTROSURGICAL) ×3
ELECTRODE REM PT RTRN 9FT ADLT (ELECTROSURGICAL) ×1 IMPLANT
GAUZE SPONGE 4X4 12PLY STRL (GAUZE/BANDAGES/DRESSINGS) ×3 IMPLANT
GAUZE SPONGE 4X4 16PLY XRAY LF (GAUZE/BANDAGES/DRESSINGS) ×3 IMPLANT
GLOVE BIO SURGEON STRL SZ7 (GLOVE) ×3 IMPLANT
GLOVE BIOGEL PI IND STRL 7.0 (GLOVE) ×1 IMPLANT
GLOVE BIOGEL PI INDICATOR 7.0 (GLOVE) ×2
GLOVE ECLIPSE 8.0 STRL XLNG CF (GLOVE) ×3 IMPLANT
GLOVE INDICATOR 8.0 STRL GRN (GLOVE) ×3 IMPLANT
GOWN STRL REUS W/TWL XL LVL3 (GOWN DISPOSABLE) ×6 IMPLANT
KIT BASIN OR (CUSTOM PROCEDURE TRAY) ×3 IMPLANT
LUBRICANT JELLY K Y 4OZ (MISCELLANEOUS) ×6 IMPLANT
MARKER SKIN DUAL TIP RULER LAB (MISCELLANEOUS) ×3 IMPLANT
NDL SAFETY ECLIPSE 18X1.5 (NEEDLE) ×1 IMPLANT
NEEDLE HYPO 18GX1.5 SHARP (NEEDLE) ×2
NEEDLE HYPO 22GX1.5 SAFETY (NEEDLE) ×3 IMPLANT
NEEDLE HYPO 25X1 1.5 SAFETY (NEEDLE) ×3 IMPLANT
PACK LITHOTOMY IV (CUSTOM PROCEDURE TRAY) ×3 IMPLANT
PAD ABD 8X10 STRL (GAUZE/BANDAGES/DRESSINGS) ×3 IMPLANT
SPONGE SURGIFOAM ABS GEL 12-7 (HEMOSTASIS) ×3 IMPLANT
SUT CHROMIC 3 0 SH 27 (SUTURE) IMPLANT
SYR 20CC LL (SYRINGE) ×3 IMPLANT
SYR CONTROL 10ML LL (SYRINGE) ×3 IMPLANT
TOWEL OR 17X26 10 PK STRL BLUE (TOWEL DISPOSABLE) ×3 IMPLANT
TOWEL OR NON WOVEN STRL DISP B (DISPOSABLE) IMPLANT
YANKAUER SUCT BULB TIP 10FT TU (MISCELLANEOUS) ×6 IMPLANT

## 2016-02-24 NOTE — Progress Notes (Signed)
Inpatient Diabetes Program Recommendations  AACE/ADA: New Consensus Statement on Inpatient Glycemic Control (2015)  Target Ranges:  Prepandial:   less than 140 mg/dL      Peak postprandial:   less than 180 mg/dL (1-2 hours)      Critically ill patients:  140 - 180 mg/dL   Lab Results  Component Value Date   GLUCAP 187 (H) 02/24/2016    Review of Glycemic Control  New-onset DM. Pt states he had a cold and congestion and started to drink large quantities of OJ and Gatorade. Has lost 25 pounds recently. States he had begun going to the gym and trying to eat a little healthier. Both M and F had DM. Travels frequently therefore eats out, but states he will try to make healthier choices. Discussed diabetes diagnosis and pt states he had no questions. [facility] Instructed to make appt within 7-10 days. Will need prescription for meter, strips and supplies.  Recommendations: D/C on metformin  Diet and exercise daily. F/U with PCP within a week. Check blood sugars 1-2x/day and take logbook to MD appt. OP diabetes education ordered.  Thank you. Ailene Ardshonda Reed Dady, RD, LDN, CDE Inpatient Diabetes Coordinator [phone]

## 2016-02-24 NOTE — Progress Notes (Signed)
Nursing Note: Spoke with the pharmacist and made them aware of positive blood cultures.Pharmacist will f/u.wbb

## 2016-02-24 NOTE — Progress Notes (Signed)
PROGRESS NOTE    Paul Nichols  ZOX:[id number] DOB: Jan 17, 1955 DOA: 02/23/2016 PCP: No PCP Per Patient   Brief Narrative:  61 yo male with no medical history, presents with rectal pain, for last 2 days. On initial evaluation found with sepsis features. Positive perianal abscess, unsuccessful I&D in the ED. Admitted for IV fluids, antibiotics and surgery evaluation.    Assessment & Plan:   Active Problems:   Sepsis (HCC)   Type 2 diabetes mellitus (HCC)   Hyperglycemia   Dehydration   AKI (acute kidney injury) (HCC)   Fever   Perirectal abscess   Leukocytosis   Lactic acid acidosis   Sinus tachycardia  1. Perirectal abscess complicated with sepsis. Will continue antibiotic therapy with IV zosyn, will follow on cell count, temperature curve and cultures. Will continue hydration with IV fluids, will decrease rate to avoid volume overload, will follow a restrictive IV fluid strategy. Worsening leukocytosis this am, plan to repeat I&D in the OR. Case discussed with surgery. Personally reviewed ekg with sinus tachycardia.   2. New diagnosed DMT2. Will continue insulin regimen with 15 units of glargine, 4 units premeal aspart and sliding scale. Patient tolerating po well. No anion gap noted.   3. AKI with positive anion gap metabolic acidosis. Will continue hydration with half normal saline to prevent worsening hyperchloremia and acidosis, will follow on renal panel in am. CR down to 1,0 from 1,3 with K at 3,8 and serum bicarbonate 24 with anion gap down to 8. Patient tolerating po well.     DVT prophylaxis: heparin  Code Status: full  Family Communication: I spoke with patient's family at the bedside and all questions were addressed.  Disposition Plan: home.   Consultants:   Surgery   Procedures:   I&D 02/16  Antimicrobials:   Zosyn    Subjective: Patient's pain controlled, no dyspnea, no nausea or abdominal pain.   Objective: Vitals:   02/24/16 1415 02/24/16 1430  02/24/16 1437 02/24/16 1448  BP: (!) 143/96 125/86 125/86 131/70  Pulse: 93 94  97  Resp: (!) 21 18  16   Temp:   98 F (36.7 C) 98 F (36.7 C)  TempSrc:      SpO2: 91% 96% 96% 94%  Weight:      Height:        Intake/Output Summary (Last 24 hours) at 02/24/16 1500 Last data filed at 02/24/16 1307  Gross per 24 hour  Intake             2435 ml  Output             2050 ml  Net              385 ml   Filed Weights   02/23/16 1745 02/23/16 1905 02/24/16 1136  Weight: 95.7 kg (211 lb) 96 kg (211 lb 10.3 oz) 95.7 kg (211 lb)    Examination:  General exam: not in pain or dyspnea E ENT: no pallor, oral mucosa moist.  Respiratory system: Clear to auscultation. Respiratory effort normal. No wheezing, rales or rhonchi.  Cardiovascular system: S1 & S2 heard, RRR. No JVD, murmurs, rubs, gallops or clicks. No pedal edema. Gastrointestinal system: Abdomen is nondistended, soft and nontender. No organomegaly or masses felt. Normal bowel sounds heard. Central nervous system: Alert and oriented. No focal neurological deficits. Extremities: Symmetric 5 x 5 power. Skin: No rashes, lesions or ulcers  Data Reviewed: I have personally reviewed following labs and imaging studies  CBC:  Recent Labs Lab 02/23/16 1602 02/24/16 0513  WBC 13.9* 14.4*  NEUTROABS 10.9* 11.1*  HGB 13.6 12.3*  HCT 40.4 36.8*  MCV 69.5* 69.8*  PLT 256 243   Basic Metabolic Panel:  Recent Labs Lab 02/23/16 1602 02/24/16 0513  NA 136 137  K 4.4 3.8  CL 97* 105  CO2 23 24  GLUCOSE 288* 244*  BUN 13 11  CREATININE 1.33* 1.02  CALCIUM 9.4 8.7*   GFR: Estimated Creatinine Clearance: 91.7 mL/min (by C-G formula based on SCr of 1.02 mg/dL). Liver Function Tests:  Recent Labs Lab 02/23/16 1602 02/24/16 0513  AST 26 16  ALT 26 22  ALKPHOS 61 52  BILITOT 0.9 0.9  PROT 8.6* 7.3  ALBUMIN 4.1 3.3*   No results for input(s): LIPASE, AMYLASE in the last 168 hours. No results for input(s): AMMONIA in the  last 168 hours. Coagulation Profile: No results for input(s): INR, PROTIME in the last 168 hours. Cardiac Enzymes: No results for input(s): CKTOTAL, CKMB, CKMBINDEX, TROPONINI in the last 168 hours. BNP (last 3 results) No results for input(s): PROBNP in the last 8760 hours. HbA1C: No results for input(s): HGBA1C in the last 72 hours. CBG:  Recent Labs Lab 02/23/16 2219 02/24/16 0258 02/24/16 0744 02/24/16 1148 02/24/16 1334  GLUCAP 359* 244* 247* 168* 147*   Lipid Profile: No results for input(s): CHOL, HDL, LDLCALC, TRIG, CHOLHDL, LDLDIRECT in the last 72 hours. Thyroid Function Tests:  Recent Labs  02/23/16 1754  TSH 0.761   Anemia Panel: No results for input(s): VITAMINB12, FOLATE, FERRITIN, TIBC, IRON, RETICCTPCT in the last 72 hours. Sepsis Labs:  Recent Labs Lab 02/23/16 1613 02/23/16 2251  LATICACIDVEN 3.42* 1.3    Recent Results (from the past 240 hour(s))  Blood Culture (routine x 2)     Status: None (Preliminary result)   Collection Time: 02/23/16  5:55 PM  Result Value Ref Range Status   Specimen Description BLOOD RIGHT HAND  Final   Special Requests BOTTLES DRAWN AEROBIC AND ANAEROBIC 5CC  Final   Culture   Final    NO GROWTH < 12 HOURS Performed at [facility], [address] [place], [place] [address]    Report Status PENDING  Incomplete  Blood Culture (routine x 2)     Status: None (Preliminary result)   Collection Time: 02/23/16  6:00 PM  Result Value Ref Range Status   Specimen Description BLOOD LEFT ANTECUBITAL  Final   Special Requests BOTTLES DRAWN AEROBIC AND ANAEROBIC 5CC  Final   Culture   Final    NO GROWTH < 12 HOURS Performed at [facility], [address] [place], [place] [address]    Report Status PENDING  Incomplete  MRSA PCR Screening     Status: None   Collection Time: 02/24/16 11:00 AM  Result Value Ref Range Status   MRSA by PCR NEGATIVE NEGATIVE Final    Comment:        The GeneXpert MRSA Assay  (FDA approved for NASAL specimens only), is one component of a comprehensive MRSA colonization surveillance program. It is not intended to diagnose MRSA infection nor to guide or monitor treatment for MRSA infections.          Radiology Studies: Ct Abdomen Pelvis W Contrast  Result Date: 02/23/2016 CLINICAL DATA:  Fever and rectal pain EXAM: CT ABDOMEN AND PELVIS WITH CONTRAST TECHNIQUE: Multidetector CT imaging of the abdomen and pelvis was performed using the standard protocol following bolus  administration of intravenous contrast. CONTRAST:  100mL ISOVUE-300 IOPAMIDOL (ISOVUE-300) INJECTION 61% COMPARISON:  None. FINDINGS: Lower chest: No pulmonary nodules. No visible pleural or pericardial effusion. Hepatobiliary: Normal hepatic size and contours without focal liver lesion. No perihepatic ascites. No intra- or extrahepatic biliary dilatation. Normal gallbladder. Pancreas: Normal pancreatic contours and enhancement. No peripancreatic fluid collection or pancreatic ductal dilatation. Spleen: Normal. Adrenals/Urinary Tract: Normal adrenal glands. No hydronephrosis or solid renal mass. There is a 2 cm left renal cyst. There is a subcentimeter hypoattenuating focus within the right kidney, too small characterize accurately. Stomach/Bowel: There is edema and soft tissue thickening surrounding the anus. There is a focal low-attenuation area at the 6 o'clock position that measures 1.9 x 1.6 cm. There is mild fat stranding surrounding the distal rectum. No other colonic abnormality. No small bowel dilatation. No abdominal fluid collection. The appendix is not well visualized. Vascular/Lymphatic: Normal course and caliber of the major abdominal vessels. No abdominal or pelvic adenopathy. Reproductive: Normal prostate and seminal vesicles. Musculoskeletal: Multilevel lumbar facet arthrosis. No lytic or blastic osseous lesion. Normal visualized extrathoracic and extraperitoneal soft tissues. Other: No  contributory non-categorized findings. IMPRESSION: 1. Soft tissue thickening and edema surrounding the distal rectum and anus, which may indicate proctitis. 2. Small, low-attenuation collection near the 6 o'clock position may indicate a small/developing perirectal abscess or, less likely, a fistula en ano. 3. Follow-up after clinical resolution is recommended, as a superimposed neoplastic process would be difficult to exclude. Electronically Signed   By: Deatra RobinsonKevin  Herman M.D.   On: 02/23/2016 18:48        Scheduled Meds: . acetaminophen      . [MAR Hold] heparin  5,000 Units Subcutaneous Q8H  . HYDROmorphone      . HYDROmorphone      . [MAR Hold] insulin aspart  0-15 Units Subcutaneous TID WC  . [MAR Hold] insulin aspart  0-5 Units Subcutaneous QHS  . [MAR Hold] insulin aspart  4 Units Subcutaneous TID WC  . [MAR Hold] insulin glargine  15 Units Subcutaneous Daily  . [MAR Hold] piperacillin-tazobactam (ZOSYN)  IV  3.375 g Intravenous Q8H  . [MAR Hold] senna-docusate  1 tablet Oral BID   Continuous Infusions: . 0.9 % NaCl with KCl 20 mEq / L Stopped (02/24/16 1142)     LOS: 1 day      Mauricio Annett Gulaaniel Arrien, MD [facility] Pager [phone]  If 7PM-7AM, please contact night-coverage [email or web address] Password [email or web address] 02/24/2016, 3:00 PM

## 2016-02-24 NOTE — Op Note (Signed)
Operative Note  Paul Nichols male 61 y.o. 02/24/2016  PREOPERATIVE DX:  Anorectal abscess  POSTOPERATIVE DX:  Same with fistula in ano  PROCEDURE:   Examination under anesthesia, rigid proctosigmoidoscopy, incision and drainage of anorectal abscess, fistulotomy.         Surgeon: Adolph PollackOSENBOWER,Gillis Boardley J   Assistants: None  Anesthesia: General LMA anesthesia  Indications:   This is a 61 year old male who came in with severe hyperglycemia and perirectal pain. He was seen in the emergency department and had posterior fullness but no pus could be aspirated and needle aspiration. He was admitted to the hospital for blood sugar control for his newly diagnosed diabetes and started on IV antibiotics for what was felt to be a perirectal abscess. CT scan of the pelvis demonstrated some distal proctitis and a posterior anal attenuation concerning for an abscess. He is now brought to the operating room for the above procedure.    Procedure Detail:  He was brought to the operating room and placed supine on the operating table in the general anesthetic was given. He was placed in the lithotomy position. The perianal area was sterilely prepped and draped. A timeout was performed.  Digital rectal exam was performed. There is a fullness in the left perianal area and an irregularity palpable in the anus the colon side of this. Needle aspiration was performed but no pus was evacuated. I subsequently performed a proctoscopy and noted an internal hemorrhoid in the right posterior position. There were no anal masses noted. Rigid sigmoidoscopy was performed without a bowel prep. I was able to inspect the distal rectal mucosa and did not notice any significant inflammatory changes.  Following this I made an incision in the area of fullness to the left of the anus. I then placed a hemostat in and discovered a cavity which drained some pus. He missed that easily went through the Into the cavity and out the area of  irregularity which was a fistula. This was superficial. Taking the electrocautery, I performed a fistulotomy and opened up the abscess cavity as well as the fistula. I then cauterized the bleeding portions.  Following this an anal block was performed with Exparel.  A piece of Gelfoam was placed on the left posterior lateral wound. A bulky dressing was applied.  He tolerated the procedures well without any apparent complications. He was taken to the recovery room in satisfactory condition  Estimated Blood Loss:  100 cc         Specimens: None        Complications:  * No complications entered in OR log *         Disposition: PACU - hemodynamically stable.         Condition: stable

## 2016-02-24 NOTE — Significant Event (Signed)
CRITICAL VALUE ALER Critical value received:  Positive blood cultures.two species staphlococcus and streptococcus  Date of notification:   02/24/16   Time of notification:  1935  Critical value read back:yes  Nurse who received alert:  Jamesetta OrleansWillie bernita Kaileb Monsanto,rn  MD notified (1st page):  J.Kim  Time of first page:  1937  MD notified (2nd page):  Time of second page:  Responding MD:  J.Kim  Time MD responded:  1950

## 2016-02-24 NOTE — Transfer of Care (Signed)
Immediate Anesthesia Transfer of Care Note  Patient: Paul Nichols  Procedure(s) Performed: Procedure(s): IRRIGATION AND DEBRIDEMENT PERIRECTAL ABSCESS AND ANAL FISTULA (N/A)  Patient Location: PACU  Anesthesia Type:General  Level of Consciousness:  sedated, patient cooperative and responds to stimulation  Airway & Oxygen Therapy:Patient Spontanous Breathing and Patient connected to face mask oxgen  Post-op Assessment:  Report given to PACU RN and Post -op Vital signs reviewed and stable  Post vital signs:  Reviewed and stable  Last Vitals:  Vitals:   02/24/16 0532 02/24/16 1100  BP: 135/80 121/80  Pulse: 98 90  Resp: 18 (!) 24  Temp: 37.8 C 37 C    Complications: No apparent anesthesia complications

## 2016-02-24 NOTE — Anesthesia Postprocedure Evaluation (Signed)
Anesthesia Post Note  Patient: Paul Nichols  Procedure(s) Performed: Procedure(s) (LRB): IRRIGATION AND DEBRIDEMENT PERIRECTAL ABSCESS AND ANAL FISTULA (N/A)  Anesthesia Type: General       Last Vitals:  Vitals:   02/24/16 1437 02/24/16 1448  BP: 125/86 131/70  Pulse:  97  Resp:  16  Temp: 36.7 C 36.7 C    Last Pain:  Vitals:   02/24/16 1437  TempSrc:   PainSc: 6                  Joandy Burget EDWARD

## 2016-02-24 NOTE — Progress Notes (Signed)
Assessment 1.  Perirectal abscess-unable to drain at bedside; posterior fluid collection seen on CT suggesting abscess vs complex fissure; CT also suggests some proctitis; WBC up this AM.  2.  Type II DM  Plan:  To OR today for EUA and possible drainage of abscess.  Procedure, rationale, and risks discussed with him.  Risks include but are not limited to bleeding, recurrent infection, wound healing problems, need for reoperation, anesthesia.  He seems to understand and agrees with the plan.   LOS: 1 day        Subjective: Feels better this morning.  Objective: Vital signs in last 24 hours: Temp:  [99.7 F (37.6 C)-100.7 F (38.2 C)] 100.1 F (37.8 C) (02/16 0532) Pulse Rate:  [98-119] 98 (02/16 0532) Resp:  [16-22] 18 (02/16 0532) BP: (120-146)/(67-97) 135/80 (02/16 0532) SpO2:  [94 %-100 %] 100 % (02/16 0532) Weight:  [95.7 kg (211 lb)-96 kg (211 lb 10.3 oz)] 96 kg (211 lb 10.3 oz) (02/15 1905) Last BM Date: 02/22/16  Intake/Output from previous day: 02/15 0701 - 02/16 0700 In: 1735 [I.V.:1685; IV Piggyback:50] Out: 2050 [Urine:2050] Intake/Output this shift: No intake/output data recorded.  PE: General- In NAD   Lab Results:   Recent Labs  02/23/16 1602 02/24/16 0513  WBC 13.9* 14.4*  HGB 13.6 12.3*  HCT 40.4 36.8*  PLT 256 243   BMET  Recent Labs  02/23/16 1602 02/24/16 0513  NA 136 137  K 4.4 3.8  CL 97* 105  CO2 23 24  GLUCOSE 288* 244*  BUN 13 11  CREATININE 1.33* 1.02  CALCIUM 9.4 8.7*   PT/INR No results for input(s): LABPROT, INR in the last 72 hours. Comprehensive Metabolic Panel:    Component Value Date/Time   NA 137 02/24/2016 0513   NA 136 02/23/2016 1602   K 3.8 02/24/2016 0513   K 4.4 02/23/2016 1602   CL 105 02/24/2016 0513   CL 97 (L) 02/23/2016 1602   CO2 24 02/24/2016 0513   CO2 23 02/23/2016 1602   BUN 11 02/24/2016 0513   BUN 13 02/23/2016 1602   CREATININE 1.02 02/24/2016 0513   CREATININE 1.33 (H) 02/23/2016 1602    GLUCOSE 244 (H) 02/24/2016 0513   GLUCOSE 288 (H) 02/23/2016 1602   CALCIUM 8.7 (L) 02/24/2016 0513   CALCIUM 9.4 02/23/2016 1602   AST 16 02/24/2016 0513   AST 26 02/23/2016 1602   ALT 22 02/24/2016 0513   ALT 26 02/23/2016 1602   ALKPHOS 52 02/24/2016 0513   ALKPHOS 61 02/23/2016 1602   BILITOT 0.9 02/24/2016 0513   BILITOT 0.9 02/23/2016 1602   PROT 7.3 02/24/2016 0513   PROT 8.6 (H) 02/23/2016 1602   ALBUMIN 3.3 (L) 02/24/2016 0513   ALBUMIN 4.1 02/23/2016 1602     Studies/Results: Ct Abdomen Pelvis W Contrast  Result Date: 02/23/2016 CLINICAL DATA:  Fever and rectal pain EXAM: CT ABDOMEN AND PELVIS WITH CONTRAST TECHNIQUE: Multidetector CT imaging of the abdomen and pelvis was performed using the standard protocol following bolus administration of intravenous contrast. CONTRAST:  100mL ISOVUE-300 IOPAMIDOL (ISOVUE-300) INJECTION 61% COMPARISON:  None. FINDINGS: Lower chest: No pulmonary nodules. No visible pleural or pericardial effusion. Hepatobiliary: Normal hepatic size and contours without focal liver lesion. No perihepatic ascites. No intra- or extrahepatic biliary dilatation. Normal gallbladder. Pancreas: Normal pancreatic contours and enhancement. No peripancreatic fluid collection or pancreatic ductal dilatation. Spleen: Normal. Adrenals/Urinary Tract: Normal adrenal glands. No hydronephrosis or solid renal mass. There is a 2 cm  left renal cyst. There is a subcentimeter hypoattenuating focus within the right kidney, too small characterize accurately. Stomach/Bowel: There is edema and soft tissue thickening surrounding the anus. There is a focal low-attenuation area at the 6 o'clock position that measures 1.9 x 1.6 cm. There is mild fat stranding surrounding the distal rectum. No other colonic abnormality. No small bowel dilatation. No abdominal fluid collection. The appendix is not well visualized. Vascular/Lymphatic: Normal course and caliber of the major abdominal vessels. No  abdominal or pelvic adenopathy. Reproductive: Normal prostate and seminal vesicles. Musculoskeletal: Multilevel lumbar facet arthrosis. No lytic or blastic osseous lesion. Normal visualized extrathoracic and extraperitoneal soft tissues. Other: No contributory non-categorized findings. IMPRESSION: 1. Soft tissue thickening and edema surrounding the distal rectum and anus, which may indicate proctitis. 2. Small, low-attenuation collection near the 6 o'clock position may indicate a small/developing perirectal abscess or, less likely, a fistula en ano. 3. Follow-up after clinical resolution is recommended, as a superimposed neoplastic process would be difficult to exclude. Electronically Signed   By: Deatra Robinson M.D.   On: 02/23/2016 18:48    Anti-infectives: Anti-infectives    Start     Dose/Rate Route Frequency Ordered Stop   02/24/16 0000  piperacillin-tazobactam (ZOSYN) IVPB 3.375 g     3.375 g 12.5 mL/hr over 240 Minutes Intravenous Every 8 hours 02/23/16 1814     02/23/16 1715  piperacillin-tazobactam (ZOSYN) IVPB 3.375 g     3.375 g 12.5 mL/hr over 240 Minutes Intravenous  Once 02/23/16 1706 02/23/16 1844       Paul Nichols 02/24/2016

## 2016-02-24 NOTE — Anesthesia Preprocedure Evaluation (Signed)
Anesthesia Evaluation  Patient identified by MRN, date of birth, ID band Patient awake    Reviewed: Allergy & Precautions, H&P , Patient's Chart, lab work & pertinent test results, reviewed documented beta blocker date and time   Airway Mallampati: II  TM Distance: >3 FB Neck ROM: full    Dental no notable dental hx.    Pulmonary    Pulmonary exam normal breath sounds clear to auscultation       Cardiovascular  Rhythm:regular Rate:Normal     Neuro/Psych    GI/Hepatic   Endo/Other  diabetes  Renal/GU      Musculoskeletal   Abdominal   Peds  Hematology   Anesthesia Other Findings BS 247  Reproductive/Obstetrics                             Anesthesia Physical Anesthesia Plan  ASA: II  Anesthesia Plan: General   Post-op Pain Management:    Induction: Intravenous  Airway Management Planned: LMA  Additional Equipment:   Intra-op Plan:   Post-operative Plan:   Informed Consent: I have reviewed the patients History and Physical, chart, labs and discussed the procedure including the risks, benefits and alternatives for the proposed anesthesia with the patient or authorized representative who has indicated his/her understanding and acceptance.   Dental Advisory Given and Dental advisory given  Plan Discussed with: CRNA and Surgeon  Anesthesia Plan Comments: (Discussed GA with LMA, possible sore throat, potential need to switch to ETT, N/V, pulmonary aspiration. Questions answered. )        Anesthesia Quick Evaluation

## 2016-02-24 NOTE — Progress Notes (Signed)
PHARMACY - PHYSICIAN COMMUNICATION CRITICAL VALUE ALERT - BLOOD CULTURE IDENTIFICATION (BCID)  Results for orders placed or performed during the hospital encounter of 02/23/16  Blood Culture ID Panel (Reflexed) (Collected: 02/23/2016  5:55 PM)  Result Value Ref Range   Enterococcus species NOT DETECTED NOT DETECTED   Listeria monocytogenes NOT DETECTED NOT DETECTED   Staphylococcus species DETECTED (A) NOT DETECTED   Staphylococcus aureus NOT DETECTED NOT DETECTED   Methicillin resistance NOT DETECTED NOT DETECTED   Streptococcus species DETECTED (A) NOT DETECTED   Streptococcus agalactiae NOT DETECTED NOT DETECTED   Streptococcus pneumoniae NOT DETECTED NOT DETECTED   Streptococcus pyogenes NOT DETECTED NOT DETECTED   Acinetobacter baumannii NOT DETECTED NOT DETECTED   Enterobacteriaceae species NOT DETECTED NOT DETECTED   Enterobacter cloacae complex NOT DETECTED NOT DETECTED   Escherichia coli NOT DETECTED NOT DETECTED   Klebsiella oxytoca NOT DETECTED NOT DETECTED   Klebsiella pneumoniae NOT DETECTED NOT DETECTED   Proteus species NOT DETECTED NOT DETECTED   Serratia marcescens NOT DETECTED NOT DETECTED   Haemophilus influenzae NOT DETECTED NOT DETECTED   Neisseria meningitidis NOT DETECTED NOT DETECTED   Pseudomonas aeruginosa NOT DETECTED NOT DETECTED   Candida albicans NOT DETECTED NOT DETECTED   Candida glabrata NOT DETECTED NOT DETECTED   Candida krusei NOT DETECTED NOT DETECTED   Candida parapsilosis NOT DETECTED NOT DETECTED   Candida tropicalis NOT DETECTED NOT DETECTED    Name of physician (or Provider) Contacted: Dr. Selena BattenKim  Changes to prescribed antibiotics required: none at this time  Jamse MeadGadhia, Raenell Mensing M 02/24/2016  8:13 PM

## 2016-02-25 DIAGNOSIS — E860 Dehydration: Secondary | ICD-10-CM

## 2016-02-25 DIAGNOSIS — R739 Hyperglycemia, unspecified: Secondary | ICD-10-CM

## 2016-02-25 LAB — CBC WITH DIFFERENTIAL/PLATELET
BASOS PCT: 0 %
Basophils Absolute: 0 10*3/uL (ref 0.0–0.1)
EOS ABS: 0.1 10*3/uL (ref 0.0–0.7)
Eosinophils Relative: 1 %
HCT: 37.2 % — ABNORMAL LOW (ref 39.0–52.0)
HEMOGLOBIN: 12.3 g/dL — AB (ref 13.0–17.0)
LYMPHS ABS: 1.8 10*3/uL (ref 0.7–4.0)
LYMPHS PCT: 15 %
MCH: 23.4 pg — AB (ref 26.0–34.0)
MCHC: 33.1 g/dL (ref 30.0–36.0)
MCV: 70.9 fL — ABNORMAL LOW (ref 78.0–100.0)
Monocytes Absolute: 1.1 10*3/uL — ABNORMAL HIGH (ref 0.1–1.0)
Monocytes Relative: 9 %
NEUTROS ABS: 9.1 10*3/uL — AB (ref 1.7–7.7)
Neutrophils Relative %: 75 %
Platelets: 259 10*3/uL (ref 150–400)
RBC: 5.25 MIL/uL (ref 4.22–5.81)
RDW: 14.5 % (ref 11.5–15.5)
WBC: 12.1 10*3/uL — ABNORMAL HIGH (ref 4.0–10.5)

## 2016-02-25 LAB — URINE CULTURE: Culture: NO GROWTH

## 2016-02-25 LAB — COMPREHENSIVE METABOLIC PANEL
ALBUMIN: 3.1 g/dL — AB (ref 3.5–5.0)
ALK PHOS: 56 U/L (ref 38–126)
ALT: 26 U/L (ref 17–63)
ANION GAP: 10 (ref 5–15)
AST: 31 U/L (ref 15–41)
BILIRUBIN TOTAL: 1.1 mg/dL (ref 0.3–1.2)
BUN: 9 mg/dL (ref 6–20)
CALCIUM: 8.3 mg/dL — AB (ref 8.9–10.3)
CO2: 24 mmol/L (ref 22–32)
Chloride: 104 mmol/L (ref 101–111)
Creatinine, Ser: 1.08 mg/dL (ref 0.61–1.24)
GFR calc Af Amer: >60 mL/min (ref >60)
GFR calc non Af Amer: >60 mL/min (ref >60)
GLUCOSE: 158 mg/dL — AB (ref 65–99)
Potassium: 3.6 mmol/L (ref 3.5–5.1)
SODIUM: 138 mmol/L (ref 135–145)
Total Protein: 7.1 g/dL (ref 6.5–8.1)

## 2016-02-25 LAB — GLUCOSE, CAPILLARY
GLUCOSE-CAPILLARY: 154 mg/dL — AB (ref 65–99)
GLUCOSE-CAPILLARY: 163 mg/dL — AB (ref 65–99)
GLUCOSE-CAPILLARY: 152 mg/dL — AB (ref 65–99)
Glucose-Capillary: 157 mg/dL — ABNORMAL HIGH (ref 65–99)
Glucose-Capillary: 159 mg/dL — ABNORMAL HIGH (ref 65–99)

## 2016-02-25 LAB — HEMOGLOBIN A1C
Hgb A1c MFr Bld: 13.1 % — ABNORMAL HIGH (ref 4.8–5.6)
MEAN PLASMA GLUCOSE: 329 mg/dL

## 2016-02-25 MED ORDER — INSULIN GLARGINE 100 UNIT/ML ~~LOC~~ SOLN
10.0000 [IU] | Freq: Every day | SUBCUTANEOUS | Status: DC
  Administered 2016-02-25 – 2016-02-26 (×2): 10 [IU] via SUBCUTANEOUS
  Filled 2016-02-25 (×2): qty 0.1

## 2016-02-25 NOTE — Progress Notes (Signed)
Continue with Diabetic teaching. Pt. States he watched some of the videos and has information booklet. Pt. educated on sq. Insulin injection. Able to draw up insulin and self administer. Unsure it pt. to go home on injection or po medication. Continue with education and reinforcement.

## 2016-02-25 NOTE — Discharge Instructions (Addendum)
Anal Fistula Introduction An anal fistula is an abnormal tunnel that develops between the bowel and the skin near the outside of the anus, where stool (feces) comes out. The anus has many tiny glands that make lubricating fluid. Sometimes, these glands become plugged and infected, and that can cause a fluid-filled pocket (abscess) to form. An anal fistula often develops after this infection or abscess. What are the causes? In most cases, an anal fistula is caused by a past or current anal abscess. Other causes include:  A complication of surgery.  Trauma to the rectal area.  Radiation to the area.  Medical conditions or diseases, such as:  Chronic inflammatory bowel disease, such as Crohn disease or ulcerative colitis.  Colon cancer or rectal cancer.  Diverticular disease, such as diverticulitis.  An STD (sexually transmitted disease), such as gonorrhea, chlamydia, or syphilis.  An infection that is caused by HIV (human immunodeficiency virus).  Foreign body in the rectum. What are the signs or symptoms? Symptoms of this condition include:  Throbbing or constant pain that may be worse while you are sitting.  Swelling or irritation around the anus.  Drainage of pus or blood from an opening near the anus.  Pain with bowel movements.  Fever or chills. How is this diagnosed? Your health care provider will examine the area to find the openings of the anal fistula and the fistula tract. The external opening of the anal fistula may be seen during a physical exam. You may also have tests, including:  An exam of the rectal area with a gloved hand (digital rectal exam).  An exam with a probe or scope to help locate the internal opening of the fistula.  Imaging tests to find the exact location and path of the fistula. These tests may include X-rays, an ultrasound, a CT scan, or MRI. The path is made visible by a dye that is injected into the fistula opening. You may have other tests  to find the cause of the anal fistula. How is this treated? The most common treatment for an anal fistula is surgery. The type of surgery that is used will depend on where the fistula is located and how complex the fistula is. Surgical options include:  A fistulotomy. The whole fistula is opened up, and the contents are drained to promote healing.  Seton placement. A silk string (seton) is placed into the fistula during a fistulotomy. This helps to drain any infection to promote healing.  Advancement flap procedure. Tissue is removed from your rectum or the skin around the anus and is attached to the opening of the fistula.  Bioprosthetic plug. A cone-shaped plug is made from your tissue and is used to block the opening of the fistula. Some anal fistulas do not require surgery. A nonsurgical treatment option involves injecting a fibrin glue to seal the fistula. You also may be prescribed an antibiotic medicine to treat an infection. Follow these instructions at home: Medicines  Take over-the-counter and prescription medicines only as told by your health care provider.  If you were prescribed an antibiotic medicine, take it as told by your health care provider. Do not stop taking the antibiotic even if you start to feel better.  Use a stool softener or a laxative if told to do so by your health care provider. General instructions  Eat a high-fiber diet as told by your health care provider. This can help to prevent constipation.  Drink enough fluid to keep your urine clear or  pale yellow.  Take a warm sitz bath for 15-20 minutes, 3-4 times per day, or as told by your health care provider. Sitz baths can ease your pain and discomfort and help with healing.  Follow good hygiene to keep the anal area as clean and dry as possible. Use wet toilet paper or a moist towelette after each bowel movement.  Keep all follow-up visits as told by your health care provider. This is important. Contact a  health care provider if:  You have increased pain that is not controlled with medicines.  You have new redness or swelling around the anal area.  You have new fluid, blood, or pus coming from the anal area.  You have tenderness or warmth around the anal area. Get help right away if:  You have a fever.  You have severe pain.  You have chills or diarrhea.  You have severe problems urinating or having a bowel movement. This information is not intended to replace advice given to you by your health care provider. Make sure you discuss any questions you have with your health care provider. Document Released: 12/08/2007 Document Revised: 06/02/2015 Document Reviewed: 03/22/2014  2017 [employer]vier

## 2016-02-25 NOTE — Progress Notes (Signed)
Nursing Note: Pt watched video for diabetes.Reviewed disease process,drawing up insulin,cbgs to check glucose,and need for good foot hygiene and to always wear shoes and to never go bear foot.Pt able to verbalize understanding but needs re-enforcement as he is a new diabetic.Pt was very receptive to education.wbb

## 2016-02-25 NOTE — Progress Notes (Signed)
1 Day Post-Op  Subjective: Pain controlled.  Minimal drainage.  Getting diabetes education.    Objective: Vital signs in last 24 hours: Temp:  [98 F (36.7 C)-100 F (37.8 C)] 100 F (37.8 C) (02/17 0520) Pulse Rate:  [90-111] 94 (02/17 0520) Resp:  [9-24] 16 (02/17 0520) BP: (121-158)/(70-104) 137/86 (02/17 0520) SpO2:  [91 %-100 %] 98 % (02/17 0520) Weight:  [95.7 kg (211 lb)] 95.7 kg (211 lb) (02/16 1136) Last BM Date: 02/25/16  Intake/Output from previous day: 02/16 0701 - 02/17 0700 In: 2160 [P.O.:560; I.V.:1600] Out: 550 [Urine:550] Intake/Output this shift: Total I/O In: 50 [IV Piggyback:50] Out: 850 [Urine:850]  General appearance: alert, cooperative and no distress Incision/Wound: minimal staining on pad.  no erythema.    Lab Results:   Recent Labs  02/24/16 0513 02/25/16 0410  WBC 14.4* 12.1*  HGB 12.3* 12.3*  HCT 36.8* 37.2*  PLT 243 259   BMET  Recent Labs  02/24/16 0513 02/25/16 0410  NA 137 138  K 3.8 3.6  CL 105 104  CO2 24 24  GLUCOSE 244* 158*  BUN 11 9  CREATININE 1.02 1.08  CALCIUM 8.7* 8.3*   PT/INR No results for input(s): LABPROT, INR in the last 72 hours. ABG No results for input(s): PHART, HCO3 in the last 72 hours.  Invalid input(s): PCO2, PO2  Studies/Results: Ct Abdomen Pelvis W Contrast  Result Date: 02/23/2016 CLINICAL DATA:  Fever and rectal pain EXAM: CT ABDOMEN AND PELVIS WITH CONTRAST TECHNIQUE: Multidetector CT imaging of the abdomen and pelvis was performed using the standard protocol following bolus administration of intravenous contrast. CONTRAST:  ISOVUE-300 IOPAMIDOL (ISOVUE-300) INJECTION 61% COMPARISON:  None. FINDINGS: Lower chest: No pulmonary nodules. No visible pleural or pericardial effusion. Hepatobiliary: Normal hepatic size and contours without focal liver lesion. No perihepatic ascites. No intra- or extrahepatic biliary dilatation. Normal gallbladder. Pancreas: Normal pancreatic contours and  enhancement. No peripancreatic fluid collection or pancreatic ductal dilatation. Spleen: Normal. Adrenals/Urinary Tract: Normal adrenal glands. No hydronephrosis or solid renal mass. There is a 2 cm left renal cyst. There is a subcentimeter hypoattenuating focus within the right kidney, too small characterize accurately. Stomach/Bowel: There is edema and soft tissue thickening surrounding the anus. There is a focal low-attenuation area at the 6 o'clock position that measures 1.9 x 1.6 cm. There is mild fat stranding surrounding the distal rectum. No other colonic abnormality. No small bowel dilatation. No abdominal fluid collection. The appendix is not well visualized. Vascular/Lymphatic: Normal course and caliber of the major abdominal vessels. No abdominal or pelvic adenopathy. Reproductive: Normal prostate and seminal vesicles. Musculoskeletal: Multilevel lumbar facet arthrosis. No lytic or blastic osseous lesion. Normal visualized extrathoracic and extraperitoneal soft tissues. Other: No contributory non-categorized findings. IMPRESSION: 1. Soft tissue thickening and edema surrounding the distal rectum and anus, which may indicate proctitis. 2. Small, low-attenuation collection near the 6 o'clock position may indicate a small/developing perirectal abscess or, less likely, a fistula en ano. 3. Follow-up after clinical resolution is recommended, as a superimposed neoplastic process would be difficult to exclude. Electronically Signed   By: Deatra Robinson M.D.   On: 02/23/2016 18:48    Anti-infectives: Anti-infectives    Start     Dose/Rate Route Frequency Ordered Stop   02/24/16 0000  piperacillin-tazobactam (ZOSYN) IVPB 3.375 g     3.375 g 12.5 mL/hr over 240 Minutes Intravenous Every 8 hours 02/23/16 1814     02/23/16 1715  piperacillin-tazobactam (ZOSYN) IVPB 3.375 g  3.375 g 12.5 mL/hr over 240 Minutes Intravenous  Once 02/23/16 1706 02/23/16 1844      Assessment/Plan: s/p  Procedure(s): IRRIGATION AND DEBRIDEMENT PERIRECTAL ABSCESS AND ANAL FISTULA (N/A) sitz baths PRN  Follow up in 2 weeks Avoid constipation. Dry dressing as needed.   Diabetes control  Will sign off.  Call if questions arise.    LOS: 2 days    [facility] CenterBYERLY,Myli Pae 02/25/2016

## 2016-02-25 NOTE — Progress Notes (Signed)
PROGRESS NOTE    Paul Nichols  ZHY:[id number] DOB: [birth year] DOA: 02/23/2016 PCP: No PCP Per Patient    Brief Narrative:  61 yo male with no medical history, presents with rectal pain, for last 2 days. On initial evaluation found with sepsis features. Positive perianal abscess, unsuccessful I&D in the ED. Admitted for IV fluids, antibiotics and surgery evaluation.    Assessment & Plan:   Active Problems:   Sepsis (HCC)   Type 2 diabetes mellitus (HCC)   Hyperglycemia   Dehydration   AKI (acute kidney injury) (HCC)   Fever   Perirectal abscess   Leukocytosis   Lactic acid acidosis   Sinus tachycardia   1. Perirectal abscess complicated with sepsis. Antibiotic therapy with IV zosyn #2, wbc trending down to 12.1, patient sp I&D in the OR. Positive blood cultures for streptococcus, will repeat blood cultures today, will hold on IV fluids and follow on cell count in am. Patient has been afebrile. Will plan to discharge home if repeat blood cultures have no growth.   2. New diagnosed DMT2. Decrease insulin glargine to 10 units to prevent hypoglycmia, continue 4 units premeal aspart and sliding scale. Patient tolerating po well. Capillary glucose [phone].   3. AKI with positive anion gap metabolic acidosis. Patient tolerating po well, will hold on IV fluids.  4. Insomnia. Continue ambien.    DVT prophylaxis: heparin  Code Status: full  Family Communication: I spoke with patient's family at the bedside and all questions were addressed.  Disposition Plan: home.   Consultants:   Surgery   Procedures:   I&D 02/16  Antimicrobials:   Zosyn    DVT prophylaxis: Home  Code Status: Full  Family Communication: No family at the bedside  Disposition Plan: home   Consultants:   Surgery   Procedures:   Antimicrobials:    Zosyn    Subjective: Patient with no dyspnea, no nausea or vomiting, rectal pain controlled.   Objective: Vitals:   02/24/16 1437  02/24/16 1448 02/24/16 2146 02/25/16 0520  BP: 125/86 131/70 136/79 137/86  Pulse:  97 (!) 111 94  Resp:  16 16 16   Temp: 98 F (36.7 C) 98 F (36.7 C) 98.2 F (36.8 C) 100 F (37.8 C)  TempSrc:   Oral Oral  SpO2: 96% 94% 97% 98%  Weight:      Height:        Intake/Output Summary (Last 24 hours) at 02/25/16 0939 Last data filed at 02/25/16 0737  Gross per 24 hour  Intake             2210 ml  Output             1400 ml  Net              810 ml   Filed Weights   02/23/16 1745 02/23/16 1905 02/24/16 1136  Weight: 95.7 kg (211 lb) 96 kg (211 lb 10.3 oz) 95.7 kg (211 lb)    Examination:  General exam: not in pain or dyspnea E ENT: no pallor or icterus.  Respiratory system: Clear to auscultation. Respiratory effort normal. Cardiovascular system: S1 & S2 heard, RRR. No JVD, murmurs, rubs, gallops or clicks. No pedal edema. Gastrointestinal system: Abdomen is nondistended, soft and nontender. No organomegaly or masses felt. Normal bowel sounds heard. Central nervous system: Alert and oriented. No focal neurological deficits. Extremities: Symmetric 5 x 5 power. Skin: No rashes, lesions or ulcers  Data Reviewed: I have personally reviewed following labs  and imaging studies  CBC:  Recent Labs Lab 02/23/16 1602 02/24/16 0513 02/25/16 0410  WBC 13.9* 14.4* 12.1*  NEUTROABS 10.9* 11.1* 9.1*  HGB 13.6 12.3* 12.3*  HCT 40.4 36.8* 37.2*  MCV 69.5* 69.8* 70.9*  PLT 256 243 259   Basic Metabolic Panel:  Recent Labs Lab 02/23/16 1602 02/24/16 0513 02/25/16 0410  NA 136 137 138  K 4.4 3.8 3.6  CL 97* 105 104  CO2 23 24 24   GLUCOSE 288* 244* 158*  BUN 13 11 9   CREATININE 1.33* 1.02 1.08  CALCIUM 9.4 8.7* 8.3*   GFR: Estimated Creatinine Clearance: 86.6 mL/min (by C-G formula based on SCr of 1.08 mg/dL). Liver Function Tests:  Recent Labs Lab 02/23/16 1602 02/24/16 0513 02/25/16 0410  AST 26 16 31   ALT 26 22 26   ALKPHOS 61 52 56  BILITOT 0.9 0.9 1.1  PROT  8.6* 7.3 7.1  ALBUMIN 4.1 3.3* 3.1*   No results for input(s): LIPASE, AMYLASE in the last 168 hours. No results for input(s): AMMONIA in the last 168 hours. Coagulation Profile: No results for input(s): INR, PROTIME in the last 168 hours. Cardiac Enzymes: No results for input(s): CKTOTAL, CKMB, CKMBINDEX, TROPONINI in the last 168 hours. BNP (last 3 results) No results for input(s): PROBNP in the last 8760 hours. HbA1C:  Recent Labs  02/23/16 1754  HGBA1C 13.1*   CBG:  Recent Labs Lab 02/24/16 1334 02/24/16 1650 02/24/16 2145 02/25/16 0321 02/25/16 0753  GLUCAP 147* 187* 231* 157* 154*   Lipid Profile: No results for input(s): CHOL, HDL, LDLCALC, TRIG, CHOLHDL, LDLDIRECT in the last 72 hours. Thyroid Function Tests:  Recent Labs  02/23/16 1754  TSH 0.761   Anemia Panel: No results for input(s): VITAMINB12, FOLATE, FERRITIN, TIBC, IRON, RETICCTPCT in the last 72 hours. Sepsis Labs:  Recent Labs Lab 02/23/16 1613 02/23/16 2251  LATICACIDVEN 3.42* 1.3    Recent Results (from the past 240 hour(s))  Blood Culture (routine x 2)     Status: None (Preliminary result)   Collection Time: 02/23/16  5:55 PM  Result Value Ref Range Status   Specimen Description BLOOD RIGHT HAND  Final   Special Requests BOTTLES DRAWN AEROBIC AND ANAEROBIC 5CC  Final   Culture  Setup Time   Final    GRAM POSITIVE COCCI IN CLUSTERS GRAM POSITIVE COCCI IN CHAINS IN BOTH AEROBIC AND ANAEROBIC BOTTLES CRITICAL RESULT CALLED TO, READ BACK BY AND VERIFIED WITH: W. BAKER, RN AT [facility], 02/24/16 AT 1935 BY Mickel Fuchs Performed at [facility], [address] [place], [place] [address]    Culture GRAM POSITIVE COCCI  Final   Report Status PENDING  Incomplete  Blood Culture ID Panel (Reflexed)     Status: Abnormal   Collection Time: 02/23/16  5:55 PM  Result Value Ref Range Status   Enterococcus species NOT DETECTED NOT DETECTED Final   Listeria monocytogenes NOT DETECTED NOT DETECTED  Final   Staphylococcus species DETECTED (A) NOT DETECTED Final    Comment: Methicillin (oxacillin) susceptible coagulase negative staphylococcus. Possible blood culture contaminant (unless isolated from more than one blood culture draw or clinical case suggests pathogenicity). No antibiotic treatment is indicated for blood  culture contaminants. CRITICAL RESULT CALLED TO, READ BACK BY AND VERIFIED WITH: W. BAKER, RN AT [facility], 02/24/16 AT 1934 BY J FUDESCO    Staphylococcus aureus NOT DETECTED NOT DETECTED Final   Methicillin resistance NOT DETECTED NOT DETECTED Final   Streptococcus species DETECTED (A) NOT  DETECTED Final    Comment: Not Enterococcus species, Streptococcus agalactiae, Streptococcus pyogenes, or Streptococcus pneumoniae. CRITICAL RESULT CALLED TO, READ BACK BY AND VERIFIED WITH: W. BAKER, RN AT [facility], 02/24/16 AT 1934 BY J FUDESCO    Streptococcus agalactiae NOT DETECTED NOT DETECTED Final   Streptococcus pneumoniae NOT DETECTED NOT DETECTED Final   Streptococcus pyogenes NOT DETECTED NOT DETECTED Final   Acinetobacter baumannii NOT DETECTED NOT DETECTED Final   Enterobacteriaceae species NOT DETECTED NOT DETECTED Final   Enterobacter cloacae complex NOT DETECTED NOT DETECTED Final   Escherichia coli NOT DETECTED NOT DETECTED Final   Klebsiella oxytoca NOT DETECTED NOT DETECTED Final   Klebsiella pneumoniae NOT DETECTED NOT DETECTED Final   Proteus species NOT DETECTED NOT DETECTED Final   Serratia marcescens NOT DETECTED NOT DETECTED Final   Haemophilus influenzae NOT DETECTED NOT DETECTED Final   Neisseria meningitidis NOT DETECTED NOT DETECTED Final   Pseudomonas aeruginosa NOT DETECTED NOT DETECTED Final   Candida albicans NOT DETECTED NOT DETECTED Final   Candida glabrata NOT DETECTED NOT DETECTED Final   Candida krusei NOT DETECTED NOT DETECTED Final   Candida parapsilosis NOT DETECTED NOT DETECTED Final   Candida tropicalis NOT DETECTED NOT DETECTED Final    Comment:  Performed at [facility], [address] [place], [place] [address]  Blood Culture (routine x 2)     Status: None (Preliminary result)   Collection Time: 02/23/16  6:00 PM  Result Value Ref Range Status   Specimen Description BLOOD LEFT ANTECUBITAL  Final   Special Requests BOTTLES DRAWN AEROBIC AND ANAEROBIC 5CC  Final   Culture  Setup Time   Final    GRAM POSITIVE COCCI IN CHAINS ANAEROBIC BOTTLE ONLY CRITICAL VALUE NOTED.  VALUE IS CONSISTENT WITH PREVIOUSLY REPORTED AND CALLED VALUE.    Culture   Final    NO GROWTH < 12 HOURS Performed at [facility], [address], [place], [place] [address]    Report Status PENDING  Incomplete  Urine culture     Status: None   Collection Time: 02/24/16  6:57 AM  Result Value Ref Range Status   Specimen Description URINE, CLEAN CATCH  Final   Special Requests NONE  Final   Culture   Final    NO GROWTH Performed at [facility], [address], [place], [place] [address]    Report Status 02/25/2016 FINAL  Final  MRSA PCR Screening     Status: None   Collection Time: 02/24/16 11:00 AM  Result Value Ref Range Status   MRSA by PCR NEGATIVE NEGATIVE Final    Comment:        The GeneXpert MRSA Assay (FDA approved for NASAL specimens only), is one component of a comprehensive MRSA colonization surveillance program. It is not intended to diagnose MRSA infection nor to guide or monitor treatment for MRSA infections.          Radiology Studies: Ct Abdomen Pelvis W Contrast  Result Date: 02/23/2016 CLINICAL DATA:  Fever and rectal pain EXAM: CT ABDOMEN AND PELVIS WITH CONTRAST TECHNIQUE: Multidetector CT imaging of the abdomen and pelvis was performed using the standard protocol following bolus administration of intravenous contrast. CONTRAST:  ISOVUE-300 IOPAMIDOL (ISOVUE-300) INJECTION 61% COMPARISON:  None. FINDINGS: Lower chest: No pulmonary nodules. No visible pleural or pericardial effusion. Hepatobiliary:  Normal hepatic size and contours without focal liver lesion. No perihepatic ascites. No intra- or extrahepatic biliary dilatation. Normal gallbladder. Pancreas: Normal pancreatic  contours and enhancement. No peripancreatic fluid collection or pancreatic ductal dilatation. Spleen: Normal. Adrenals/Urinary Tract: Normal adrenal glands. No hydronephrosis or solid renal mass. There is a 2 cm left renal cyst. There is a subcentimeter hypoattenuating focus within the right kidney, too small characterize accurately. Stomach/Bowel: There is edema and soft tissue thickening surrounding the anus. There is a focal low-attenuation area at the 6 o'clock position that measures 1.9 x 1.6 cm. There is mild fat stranding surrounding the distal rectum. No other colonic abnormality. No small bowel dilatation. No abdominal fluid collection. The appendix is not well visualized. Vascular/Lymphatic: Normal course and caliber of the major abdominal vessels. No abdominal or pelvic adenopathy. Reproductive: Normal prostate and seminal vesicles. Musculoskeletal: Multilevel lumbar facet arthrosis. No lytic or blastic osseous lesion. Normal visualized extrathoracic and extraperitoneal soft tissues. Other: No contributory non-categorized findings. IMPRESSION: 1. Soft tissue thickening and edema surrounding the distal rectum and anus, which may indicate proctitis. 2. Small, low-attenuation collection near the 6 o'clock position may indicate a small/developing perirectal abscess or, less likely, a fistula en ano. 3. Follow-up after clinical resolution is recommended, as a superimposed neoplastic process would be difficult to exclude. Electronically Signed   By: Deatra RobinsonKevin  Herman M.D.   On: 02/23/2016 18:48        Scheduled Meds: . heparin  5,000 Units Subcutaneous Q8H  . insulin aspart  0-15 Units Subcutaneous TID WC  . insulin aspart  0-5 Units Subcutaneous QHS  . insulin aspart  4 Units Subcutaneous TID WC  . insulin glargine  15 Units  Subcutaneous Daily  . piperacillin-tazobactam (ZOSYN)  IV  3.375 g Intravenous Q8H  . senna-docusate  1 tablet Oral BID   Continuous Infusions: . sodium chloride 75 mL/hr at 02/24/16 1515     LOS: 2 days     Coralie KeensMauricio Daniel Ayleah Hofmeister, MD [facility] Pager [phone]  If 7PM-7AM, please contact night-coverage [email or web address] Password [email or web address] 02/25/2016, 9:39 AM

## 2016-02-26 DIAGNOSIS — E872 Acidosis: Secondary | ICD-10-CM

## 2016-02-26 LAB — CBC WITH DIFFERENTIAL/PLATELET
BASOS ABS: 0 10*3/uL (ref 0.0–0.1)
Basophils Relative: 0 %
EOS ABS: 0.2 10*3/uL (ref 0.0–0.7)
Eosinophils Relative: 3 %
HCT: 34.4 % — ABNORMAL LOW (ref 39.0–52.0)
Hemoglobin: 11.4 g/dL — ABNORMAL LOW (ref 13.0–17.0)
LYMPHS ABS: 2 10*3/uL (ref 0.7–4.0)
Lymphocytes Relative: 27 %
MCH: 22.7 pg — ABNORMAL LOW (ref 26.0–34.0)
MCHC: 33.1 g/dL (ref 30.0–36.0)
MCV: 68.5 fL — ABNORMAL LOW (ref 78.0–100.0)
MONO ABS: 0.5 10*3/uL (ref 0.1–1.0)
Monocytes Relative: 7 %
NEUTROS ABS: 4.8 10*3/uL (ref 1.7–7.7)
Neutrophils Relative %: 63 %
PLATELETS: 286 10*3/uL (ref 150–400)
RBC: 5.02 MIL/uL (ref 4.22–5.81)
RDW: 14.5 % (ref 11.5–15.5)
WBC: 7.5 10*3/uL (ref 4.0–10.5)

## 2016-02-26 LAB — GLUCOSE, CAPILLARY
GLUCOSE-CAPILLARY: 185 mg/dL — AB (ref 65–99)
Glucose-Capillary: 180 mg/dL — ABNORMAL HIGH (ref 65–99)
Glucose-Capillary: 177 mg/dL — ABNORMAL HIGH (ref 65–99)

## 2016-02-26 MED ORDER — GLUCOSE BLOOD VI STRP: ORAL_STRIP | 12 refills | Status: DC

## 2016-02-26 MED ORDER — POLYETHYLENE GLYCOL 3350 17 G PO PACK: 17.0000 g | PACK | Freq: Every day | ORAL | 0 refills | Status: DC

## 2016-02-26 MED ORDER — BLOOD GLUCOSE METER KIT: PACK | 0 refills | Status: DC

## 2016-02-26 MED ORDER — FREESTYLE LANCETS MISC: 12 refills | Status: DC

## 2016-02-26 MED ORDER — AMOXICILLIN-POT CLAVULANATE 875-125 MG PO TABS: 1.0000 | ORAL_TABLET | Freq: Two times a day (BID) | ORAL | 0 refills | Status: DC

## 2016-02-26 MED ORDER — METFORMIN HCL 500 MG PO TABS: 500.0000 mg | ORAL_TABLET | Freq: Two times a day (BID) | ORAL | 0 refills | Status: DC

## 2016-02-26 NOTE — Discharge Summary (Addendum)
Physician Discharge Summary  Paul Nichols GNF:[id number] DOB: 04/24/1955 DOA: 02/23/2016  PCP: No PCP Per Patient  Admit date: 02/23/2016 Discharge date: 02/26/2016  Admitted From:  Home Disposition: Home   Recommendations for Outpatient Follow-up:  1. Follow up with PCP in 1- weeks 2. Follow with Surgery in one week 3. Patient placed on Metformin 4. Instructed to check capillary glucose every morning before breakfast and to keep a log.   Home Health: No  Equipment/Devices: No   Discharge Condition: Stable  CODE STATUS: Full  Diet recommendation: Carb Modified    Brief/Interim Summary: This is a 61 year old male who presented to the hospital with chief complaint of fever and rectal pain. He initially presented to urgent care where he was diagnosed with a rectal abscess and hyperglycemia. Apparently for last 7 days he had symptoms of an upper respiratory tract infection, for last 24 hours reported polyuria and polydipsia, associated with fevers and chills. On initial physical examination his blood pressure 133/97, heart rate 119, respiratory 16, temperature 100.2, oxygen saturation 100%. His oral mucosa was moist, his lungs where clear to auscultation bilaterally, heart S1-2 present tachycardic, abdomen was soft, nontender. Patient had tenderness and pain left upper position of rectum around the anus. Sodium 136, potassium 4.4, chloride 97, bicarbonate 23, glucose 288, BUN 13, creatinine 1.33, white count 13.9, hemoglobin 13.6, hematocrit 40.4, platelets 256, urine analysis with glucose greater than 500, 0-5 white cells, 30 protein. CT scan showed soft tissue thickening and edema surrounding the distal rectum and anus, small low-attenuation collection near the 6:00 position suggestive of developing perirectal abscess.   The patient was admitted to the hospital with the working of sepsis (present on admission) due to perirectal abscess, complicated by hyperglycemia, acute kidney injury and lactic  acidosis.  1. Sepsis due to perirectal abscess. Patient was admitted to the medical floor, received IV fluids and IV antibiotics (Zosyn), he had a incision and drainage (needle aspiration) in the emergency department, patient with persistent pain and leukocytosis, requiring a second intervention in the operative room under general anesthesia, rigid proctosigmoidoscopy, incision and drainage of a rectal abscess, fistulotomy. His symptoms improved remarkably, his discharge white count is down to 7.5. Blood cultures from February 15, were positive: one bottle coagulase negative staph and a second bottle for Streptococcus group C, repeat blood cultures from October 17 are no growth. Patient will complete 10 days of antibiotic therapy with Augmentin.  2. Newly diagnosed type 2 diabetes mellitus. Patient was placed on a basal regimen of insulin as well as pre-meal and insulin sliding-scale coverage. Using 10 units of glargine +4 units pre-meal of short-acting insulin, capillary glucose 159, 180, 185. Will start patient on metformin 500 g twice daily and a close follow-up as an outpatient, he was instructed to check his capillary glucose every morning before breakfast, he received diabetic teaching.  3. Acute kidney injury within a non-gap metabolic acidosis. Patient responded well to IV fluids his discharge creatinine down to 1.08 with normal electrolytes. His initial lactate was 3.4, 7 hours later was 1.3.   4. . Rectal abscess with anal fistula. Patient will follow-up with outpatient surgery clinic in 2 weeks, sitz bath as needed, avoid constipation, dry dressings as needed.  Discharge Diagnoses:  Active Problems:   Sepsis ([place])   Type 2 diabetes mellitus (HCC)   Hyperglycemia   Dehydration   AKI (acute kidney injury) ([place])   Fever   Perirectal abscess   Leukocytosis   Lactic acid acidosis   Sinus  tachycardia    Discharge Instructions   Allergies as of 02/26/2016   No Known Allergies      Medication List    TAKE these medications   amoxicillin-clavulanate 875-125 MG tablet Commonly known as:  AUGMENTIN Take 1 tablet by mouth 2 (two) times daily.   blood glucose meter kit and supplies Dispense based on patient and insurance preference. Use up to four times daily as directed. (FOR ICD-9 250.00, 250.01).   freestyle lancets Use as instructed   glucose blood test strip Commonly known as:  ONE TOUCH ULTRA TEST Use as instructed   metFORMIN 500 MG tablet Commonly known as:  GLUCOPHAGE Take 1 tablet (500 mg total) by mouth 2 (two) times daily with a meal.   NYQUIL PO Take 30 mLs by mouth at bedtime as needed (cold symptoms).   polyethylene glycol packet Commonly known as:  MIRALAX Take 17 g by mouth daily.            Durable Medical Equipment        Start     Ordered   02/26/16 0000  DME Other see comment    Comments:  Glucometer, check blood sugar in the morning daily before meals.   02/26/16 1259     Follow-up Information    ROSENBOWER,TODD J, MD Follow up in 2 week(s).   Specialty:  General Surgery Contact information: [place] [address] [phone]        Primary Care Follow up in 1 week(s).          No Known Allergies  Consultations:  General surgery   Procedures/Studies: Ct Abdomen Pelvis W Contrast  Result Date: 02/23/2016 CLINICAL DATA:  Fever and rectal pain EXAM: CT ABDOMEN AND PELVIS WITH CONTRAST TECHNIQUE: Multidetector CT imaging of the abdomen and pelvis was performed using the standard protocol following bolus administration of intravenous contrast. CONTRAST:  16m ISOVUE-300 IOPAMIDOL (ISOVUE-300) INJECTION 61% COMPARISON:  None. FINDINGS: Lower chest: No pulmonary nodules. No visible pleural or pericardial effusion. Hepatobiliary: Normal hepatic size and contours without focal liver lesion. No perihepatic ascites. No intra- or extrahepatic biliary dilatation. Normal gallbladder. Pancreas:  Normal pancreatic contours and enhancement. No peripancreatic fluid collection or pancreatic ductal dilatation. Spleen: Normal. Adrenals/Urinary Tract: Normal adrenal glands. No hydronephrosis or solid renal mass. There is a 2 cm left renal cyst. There is a subcentimeter hypoattenuating focus within the right kidney, too small characterize accurately. Stomach/Bowel: There is edema and soft tissue thickening surrounding the anus. There is a focal low-attenuation area at the 6 o'clock position that measures 1.9 x 1.6 cm. There is mild fat stranding surrounding the distal rectum. No other colonic abnormality. No small bowel dilatation. No abdominal fluid collection. The appendix is not well visualized. Vascular/Lymphatic: Normal course and caliber of the major abdominal vessels. No abdominal or pelvic adenopathy. Reproductive: Normal prostate and seminal vesicles. Musculoskeletal: Multilevel lumbar facet arthrosis. No lytic or blastic osseous lesion. Normal visualized extrathoracic and extraperitoneal soft tissues. Other: No contributory non-categorized findings. IMPRESSION: 1. Soft tissue thickening and edema surrounding the distal rectum and anus, which may indicate proctitis. 2. Small, low-attenuation collection near the 6 o'clock position may indicate a small/developing perirectal abscess or, less likely, a fistula en ano. 3. Follow-up after clinical resolution is recommended, as a superimposed neoplastic process would be difficult to exclude. Electronically Signed   By: KUlyses JarredM.D.   On: 02/23/2016 18:48    (Echo, Carotid, EGD, Colonoscopy, ERCP)  Subjective: Patient feeling better, no nausea or vomiting, tolerating po well. No dyspnea or chest pain.   Discharge Exam: Vitals:   02/25/16 2143 02/26/16 0601  BP: 133/82 140/89  Pulse: 83 71  Resp: 16 16  Temp: 98.7 F (37.1 C) 98.3 F (36.8 C)   Vitals:   02/25/16 0520 02/25/16 1517 02/25/16 2143 02/26/16 0601  BP: 137/86 119/84 133/82  140/89  Pulse: 94 93 83 71  Resp: _0 Temp: 100 F (37.8 C) 98.8 F (37.1 C) 98.7 F (37.1 C) 98.3 F (36.8 C)  TempSrc: Oral Oral Oral Oral  SpO2: 98% 98% 100% 100%  Weight:      Height:        General: Pt is alert, awake, not in acute distress Cardiovascular: RRR, S1/S2 +, no rubs, no gallops Respiratory: CTA bilaterally, no wheezing, no rhonchi Abdominal: Soft, NT, ND, bowel sounds + Extremities: no edema, no cyanosis    The results of significant diagnostics from this hospitalization (including imaging, microbiology, ancillary and laboratory) are listed below for reference.     Microbiology: Recent Results (from the past 240 hour(s))  Blood Culture (routine x 2)     Status: Abnormal (Preliminary result)   Collection Time: 02/23/16  5:55 PM  Result Value Ref Range Status   Specimen Description BLOOD RIGHT HAND  Final   Special Requests BOTTLES DRAWN AEROBIC AND ANAEROBIC 5CC  Final   Culture  Setup Time   Final    GRAM POSITIVE COCCI IN CLUSTERS GRAM POSITIVE COCCI IN CHAINS IN BOTH AEROBIC AND ANAEROBIC BOTTLES CRITICAL RESULT CALLED TO, READ BACK BY AND VERIFIED WITH: W. BAKER, RN AT [facility], 02/24/16 AT [place]    Culture (A)  Final    STAPHYLOCOCCUS SPECIES (COAGULASE NEGATIVE) THE SIGNIFICANCE OF ISOLATING THIS ORGANISM FROM A SINGLE SET OF BLOOD CULTURES WHEN MULTIPLE SETS ARE DRAWN IS UNCERTAIN. PLEASE NOTIFY THE MICROBIOLOGY DEPARTMENT WITHIN ONE WEEK IF SPECIATION AND SENSITIVITIES ARE REQUIRED. STREPTOCOCCUS GROUP C SUSCEPTIBILITIES TO FOLLOW Performed at [facility], [place] [address], [place], [place] [address]    Report Status PENDING  Incomplete  Blood Culture ID Panel (Reflexed)     Status: Abnormal   Collection Time: 02/23/16  5:55 PM  Result Value Ref Range Status   Enterococcus species NOT DETECTED NOT DETECTED Final   Listeria monocytogenes NOT DETECTED NOT DETECTED Final   Staphylococcus species DETECTED (A) NOT DETECTED Final     Comment: Methicillin (oxacillin) susceptible coagulase negative staphylococcus. Possible blood culture contaminant (unless isolated from more than one blood culture draw or clinical case suggests pathogenicity). No antibiotic treatment is indicated for blood  culture contaminants. CRITICAL RESULT CALLED TO, READ BACK BY AND VERIFIED WITH: W. BAKER, RN AT [facility], 02/24/16 AT 1934 BY J FUDESCO    Staphylococcus aureus NOT DETECTED NOT DETECTED Final   Methicillin resistance NOT DETECTED NOT DETECTED Final   Streptococcus species DETECTED (A) NOT DETECTED Final    Comment: Not Enterococcus species, Streptococcus agalactiae, Streptococcus pyogenes, or Streptococcus pneumoniae. CRITICAL RESULT CALLED TO, READ BACK BY AND VERIFIED WITH: W. BAKER, RN AT [facility], 02/24/16 AT 1934 BY J FUDESCO    Streptococcus agalactiae NOT DETECTED NOT DETECTED Final   Streptococcus pneumoniae NOT DETECTED NOT DETECTED Final   Streptococcus pyogenes NOT DETECTED NOT DETECTED Final   Acinetobacter baumannii NOT DETECTED NOT DETECTED Final   Enterobacteriaceae species NOT DETECTED NOT DETECTED Final   Enterobacter cloacae complex NOT DETECTED NOT DETECTED  Final   Escherichia coli NOT DETECTED NOT DETECTED Final   Klebsiella oxytoca NOT DETECTED NOT DETECTED Final   Klebsiella pneumoniae NOT DETECTED NOT DETECTED Final   Proteus species NOT DETECTED NOT DETECTED Final   Serratia marcescens NOT DETECTED NOT DETECTED Final   Haemophilus influenzae NOT DETECTED NOT DETECTED Final   Neisseria meningitidis NOT DETECTED NOT DETECTED Final   Pseudomonas aeruginosa NOT DETECTED NOT DETECTED Final   Candida albicans NOT DETECTED NOT DETECTED Final   Candida glabrata NOT DETECTED NOT DETECTED Final   Candida krusei NOT DETECTED NOT DETECTED Final   Candida parapsilosis NOT DETECTED NOT DETECTED Final   Candida tropicalis NOT DETECTED NOT DETECTED Final    Comment: Performed at [facility], [place] [address]  [place], [place] [address]  Blood Culture (routine x 2)     Status: Abnormal (Preliminary result)   Collection Time: 02/23/16  6:00 PM  Result Value Ref Range Status   Specimen Description BLOOD LEFT ANTECUBITAL  Final   Special Requests BOTTLES DRAWN AEROBIC AND ANAEROBIC 5CC  Final   Culture  Setup Time   Final    GRAM POSITIVE COCCI IN CHAINS ANAEROBIC BOTTLE ONLY CRITICAL VALUE NOTED.  VALUE IS CONSISTENT WITH PREVIOUSLY REPORTED AND CALLED VALUE. Performed at [facility], [place] [address] [place], [place] [address]    Culture STREPTOCOCCUS GROUP C (A)  Final   Report Status PENDING  Incomplete  Urine culture     Status: None   Collection Time: 02/24/16  6:57 AM  Result Value Ref Range Status   Specimen Description URINE, CLEAN CATCH  Final   Special Requests NONE  Final   Culture   Final    NO GROWTH Performed at [facility], [place] [address], [place], [place] [address]    Report Status 02/25/2016 FINAL  Final  MRSA PCR Screening     Status: None   Collection Time: 02/24/16 11:00 AM  Result Value Ref Range Status   MRSA by PCR NEGATIVE NEGATIVE Final    Comment:        The GeneXpert MRSA Assay (FDA approved for NASAL specimens only), is one component of a comprehensive MRSA colonization surveillance program. It is not intended to diagnose MRSA infection nor to guide or monitor treatment for MRSA infections.   Culture, blood (Routine X 2) w Reflex to ID Panel     Status: None (Preliminary result)   Collection Time: 02/25/16 10:00 AM  Result Value Ref Range Status   Specimen Description BLOOD RIGHT HAND  5 ML IN [facility] BOTTLE  Final   Special Requests NONE  Final   Culture   Final    NO GROWTH < 24 HOURS Performed at [facility], [place] [address], [place], [place] [address]    Report Status PENDING  Incomplete  Culture, blood (Routine X 2) w Reflex to ID Panel     Status: None (Preliminary result)   Collection Time: 02/25/16 10:10 AM  Result Value Ref  Range Status   Specimen Description BLOOD LEFT ARM  5 ML IN [facility] BOTTLE  Final   Special Requests NONE  Final   Culture   Final    NO GROWTH < 24 HOURS Performed at [facility], [place] [address] [place], [place] [address]    Report Status PENDING  Incomplete     Labs: BNP (last 3 results) No results for input(s): BNP in the last 8760 hours. Basic Metabolic  Panel:  Recent Labs Lab 02/23/16 1602 02/24/16 0513 02/25/16 0410  NA 136 137 138  K 4.4 3.8 3.6  CL 97* 105 104  CO2 _0 GLUCOSE 288* 244* 158*  BUN _1 CREATININE 1.33* 1.02 1.08  CALCIUM 9.4 8.7* 8.3*   Liver Function Tests:  Recent Labs Lab 02/23/16 1602 02/24/16 0513 02/25/16 0410  AST _2 ALT _3 ALKPHOS 61 52 56  BILITOT 0.9 0.9 1.1  PROT 8.6* 7.3 7.1  ALBUMIN 4.1 3.3* 3.1*   No results for input(s): LIPASE, AMYLASE in the last 168 hours. No results for input(s): AMMONIA in the last 168 hours. CBC:  Recent Labs Lab 02/23/16 1602 02/24/16 0513 02/25/16 0410 02/26/16 0347  WBC 13.9* 14.4* 12.1* 7.5  NEUTROABS 10.9* 11.1* 9.1* 4.8  HGB 13.6 12.3* 12.3* 11.4*  HCT 40.4 36.8* 37.2* 34.4*  MCV 69.5* 69.8* 70.9* 68.5*  PLT 256 243 259 286   Cardiac Enzymes: No results for input(s): CKTOTAL, CKMB, CKMBINDEX, TROPONINI in the last 168 hours. BNP: Invalid input(s): POCBNP CBG:  Recent Labs Lab 02/25/16 1219 02/25/16 1722 02/25/16 2142 02/26/16 0315 02/26/16 0746  GLUCAP 163* 152* 159* 180* 185*   D-Dimer No results for input(s): DDIMER in the last 72 hours. Hgb A1c  Recent Labs  02/23/16 1754  HGBA1C 13.1*   Lipid Profile No results for input(s): CHOL, HDL, LDLCALC, TRIG, CHOLHDL, LDLDIRECT in the last 72 hours. Thyroid function studies  Recent Labs  02/23/16 1754  TSH 0.761   Anemia work up No results for input(s): VITAMINB12, FOLATE, FERRITIN, TIBC, IRON, RETICCTPCT in the last 72 hours. Urinalysis    Component Value Date/Time   COLORURINE  YELLOW 02/24/2016 0657   APPEARANCEUR CLEAR 02/24/2016 0657   LABSPEC 1.032 (H) 02/24/2016 0657   PHURINE 5.0 02/24/2016 0657   GLUCOSEU >=500 (A) 02/24/2016 0657   HGBUR NEGATIVE 02/24/2016 0657   BILIRUBINUR NEGATIVE 02/24/2016 0657   KETONESUR 20 (A) 02/24/2016 0657   PROTEINUR 30 (A) 02/24/2016 0657   NITRITE NEGATIVE 02/24/2016 0657   LEUKOCYTESUR NEGATIVE 02/24/2016 0657   Sepsis Labs Invalid input(s): PROCALCITONIN,  WBC,  LACTICIDVEN Microbiology Recent Results (from the past 240 hour(s))  Blood Culture (routine x 2)     Status: Abnormal (Preliminary result)   Collection Time: 02/23/16  5:55 PM  Result Value Ref Range Status   Specimen Description BLOOD RIGHT HAND  Final   Special Requests BOTTLES DRAWN AEROBIC AND ANAEROBIC 5CC  Final   Culture  Setup Time   Final    GRAM POSITIVE COCCI IN CLUSTERS GRAM POSITIVE COCCI IN CHAINS IN BOTH AEROBIC AND ANAEROBIC BOTTLES CRITICAL RESULT CALLED TO, READ BACK BY AND VERIFIED WITH: W. BAKER, RN AT [facility], 02/24/16 AT [place]    Culture (A)  Final    STAPHYLOCOCCUS SPECIES (COAGULASE NEGATIVE) THE SIGNIFICANCE OF ISOLATING THIS ORGANISM FROM A SINGLE SET OF BLOOD CULTURES WHEN MULTIPLE SETS ARE DRAWN IS UNCERTAIN. PLEASE NOTIFY THE MICROBIOLOGY DEPARTMENT WITHIN ONE WEEK IF SPECIATION AND SENSITIVITIES ARE REQUIRED. STREPTOCOCCUS GROUP C SUSCEPTIBILITIES TO FOLLOW Performed at [facility], [place] [address], [place], [place] [address]    Report Status PENDING  Incomplete  Blood Culture ID Panel (Reflexed)     Status: Abnormal   Collection Time: 02/23/16  5:55 PM  Result Value Ref Range Status   Enterococcus species NOT DETECTED NOT DETECTED Final   Listeria monocytogenes NOT DETECTED NOT DETECTED Final  Staphylococcus species DETECTED (A) NOT DETECTED Final    Comment: Methicillin (oxacillin) susceptible coagulase negative staphylococcus. Possible blood culture contaminant (unless isolated from more than one blood  culture draw or clinical case suggests pathogenicity). No antibiotic treatment is indicated for blood  culture contaminants. CRITICAL RESULT CALLED TO, READ BACK BY AND VERIFIED WITH: W. BAKER, RN AT [facility], 02/24/16 AT 1934 BY J FUDESCO    Staphylococcus aureus NOT DETECTED NOT DETECTED Final   Methicillin resistance NOT DETECTED NOT DETECTED Final   Streptococcus species DETECTED (A) NOT DETECTED Final    Comment: Not Enterococcus species, Streptococcus agalactiae, Streptococcus pyogenes, or Streptococcus pneumoniae. CRITICAL RESULT CALLED TO, READ BACK BY AND VERIFIED WITH: W. BAKER, RN AT [facility], 02/24/16 AT 1934 BY J FUDESCO    Streptococcus agalactiae NOT DETECTED NOT DETECTED Final   Streptococcus pneumoniae NOT DETECTED NOT DETECTED Final   Streptococcus pyogenes NOT DETECTED NOT DETECTED Final   Acinetobacter baumannii NOT DETECTED NOT DETECTED Final   Enterobacteriaceae species NOT DETECTED NOT DETECTED Final   Enterobacter cloacae complex NOT DETECTED NOT DETECTED Final   Escherichia coli NOT DETECTED NOT DETECTED Final   Klebsiella oxytoca NOT DETECTED NOT DETECTED Final   Klebsiella pneumoniae NOT DETECTED NOT DETECTED Final   Proteus species NOT DETECTED NOT DETECTED Final   Serratia marcescens NOT DETECTED NOT DETECTED Final   Haemophilus influenzae NOT DETECTED NOT DETECTED Final   Neisseria meningitidis NOT DETECTED NOT DETECTED Final   Pseudomonas aeruginosa NOT DETECTED NOT DETECTED Final   Candida albicans NOT DETECTED NOT DETECTED Final   Candida glabrata NOT DETECTED NOT DETECTED Final   Candida krusei NOT DETECTED NOT DETECTED Final   Candida parapsilosis NOT DETECTED NOT DETECTED Final   Candida tropicalis NOT DETECTED NOT DETECTED Final    Comment: Performed at [facility], [address], [place], [place] [address]  Blood Culture (routine x 2)     Status: Abnormal (Preliminary result)   Collection Time: 02/23/16  6:00 PM  Result Value Ref Range Status    Specimen Description BLOOD LEFT ANTECUBITAL  Final   Special Requests BOTTLES DRAWN AEROBIC AND ANAEROBIC 5CC  Final   Culture  Setup Time   Final    GRAM POSITIVE COCCI IN CHAINS ANAEROBIC BOTTLE ONLY CRITICAL VALUE NOTED.  VALUE IS CONSISTENT WITH PREVIOUSLY REPORTED AND CALLED VALUE. Performed at [facility], [place] [address], [place], [place] [address]    Culture STREPTOCOCCUS GROUP C (A)  Final   Report Status PENDING  Incomplete  Urine culture     Status: None   Collection Time: 02/24/16  6:57 AM  Result Value Ref Range Status   Specimen Description URINE, CLEAN CATCH  Final   Special Requests NONE  Final   Culture   Final    NO GROWTH Performed at [facility], [place] [address], [place], [place] [address]    Report Status 02/25/2016 FINAL  Final  MRSA PCR Screening     Status: None   Collection Time: 02/24/16 11:00 AM  Result Value Ref Range Status   MRSA by PCR NEGATIVE NEGATIVE Final    Comment:        The GeneXpert MRSA Assay (FDA approved for NASAL specimens only), is one component of a comprehensive MRSA colonization surveillance program. It is not intended to diagnose MRSA infection nor to guide or monitor treatment for MRSA infections.   Culture, blood (Routine X 2) w Reflex to ID Panel  Status: None (Preliminary result)   Collection Time: 02/25/16 10:00 AM  Result Value Ref Range Status   Specimen Description BLOOD RIGHT HAND  5 ML IN [facility] BOTTLE  Final   Special Requests NONE  Final   Culture   Final    NO GROWTH < 24 HOURS Performed at [facility], [place] [address], [place], [place] [address]    Report Status PENDING  Incomplete  Culture, blood (Routine X 2) w Reflex to ID Panel     Status: None (Preliminary result)   Collection Time: 02/25/16 10:10 AM  Result Value Ref Range Status   Specimen Description BLOOD LEFT ARM  5 ML IN [facility] BOTTLE  Final   Special Requests NONE  Final   Culture   Final    NO GROWTH < 24 HOURS Performed at  [facility], [place] [address], [place], [place] [address]    Report Status PENDING  Incomplete     Time coordinating discharge: 45 minutes  SIGNED:   Tawni Millers, MD  [facility] 02/26/2016, 11:28 AM Pager   If 7PM-7AM, please contact night-coverage [email or web address] Password [email or web address]

## 2016-02-26 NOTE — Progress Notes (Signed)
Discharge teaching completed with teach back with pt. and spouse. Discharge folder utilized. Prescriptions given for Augmenton, Glucose meter and supplies. Pt. to have pharmacy instruct on meter use. Pt. watched DM videos and has received Diabetic education. He understands to check sugars in the morning and keep a log for 7 days and bring to PCP. [facility]. Pt. Understands to call Monday to set up follow up appointments with PCP an surgery. Supplies given for sitz bath and dressing changes. Pt. feft via wheelchair with spouse at side, no respiratory distress noted. Discharged to home.

## 2016-02-27 LAB — CULTURE, BLOOD (ROUTINE X 2)

## 2016-03-01 LAB — CULTURE, BLOOD (ROUTINE X 2)
Culture: NO GROWTH
Culture: NO GROWTH

## 2016-03-19 ENCOUNTER — Ambulatory Visit: Payer: Managed Care, Other (non HMO) | Admitting: Physician Assistant

## 2016-03-26 ENCOUNTER — Encounter: Payer: Self-pay | Admitting: Physician Assistant

## 2016-03-26 ENCOUNTER — Ambulatory Visit (INDEPENDENT_AMBULATORY_CARE_PROVIDER_SITE_OTHER): Payer: 59 | Admitting: Physician Assistant

## 2016-03-26 VITALS — BP 134/80 | HR 79 | Temp 98.1°F | Resp 18 | Wt 213.8 lb

## 2016-03-26 DIAGNOSIS — E119 Type 2 diabetes mellitus without complications: Secondary | ICD-10-CM | POA: Diagnosis not present

## 2016-03-26 DIAGNOSIS — Z7689 Persons encountering health services in other specified circumstances: Secondary | ICD-10-CM | POA: Diagnosis not present

## 2016-03-26 DIAGNOSIS — K611 Rectal abscess: Secondary | ICD-10-CM | POA: Diagnosis not present

## 2016-03-26 DIAGNOSIS — Z09 Encounter for follow-up examination after completed treatment for conditions other than malignant neoplasm: Secondary | ICD-10-CM | POA: Diagnosis not present

## 2016-03-26 DIAGNOSIS — A419 Sepsis, unspecified organism: Secondary | ICD-10-CM | POA: Diagnosis not present

## 2016-03-26 MED ORDER — METFORMIN HCL 1000 MG PO TABS: 1000.0000 mg | ORAL_TABLET | Freq: Two times a day (BID) | ORAL | 3 refills | Status: DC

## 2016-03-26 NOTE — Progress Notes (Signed)
Patient ID: Paul Nichols MRN: [id number], DOB: 1955-04-23, 61 y.o. Date of Encounter: '@DATE' @  Chief Complaint:  Chief Complaint  Patient presents with  . New Patient (Initial Visit)    HPI: 61 y.o. year old AA male  presents as a New Patient to Establish Care.   Recently he was hospitalized 02/23/16-02/26/16. At that time he was developing a perirectal abscess. At that time he was also newly diagnosed with diabetes. He states that-- prior to that, he had not seen any type of medical provider in many years. He reports that both of his parents have diabetes.  I reviewed that hospital discharge summary today. He presented to the hospital with complaint of fever and rectal pain. Glucose 288. Urinalysis with glucose greater than 500.  CT Scan showed soft tissue thickening and edema surrounding the distal rectum and anus, small low attenuation collection near the 6:00 position suggestive of developing perirectal abscess.  He was admitted to the hospital with working diagnosis of sepsis due to perirectal abscess, complicated by hyperglycemia, acute kidney injury and lactic acidosis.  He was admitted, given IV fluids and IV antibiotics (Zosyn), had incision and drainage in the ED, had persistent pain and leukocytosis, requiring second intervention in the OR under general anesthesia, rigid proctosigmoidoscopy, incision and drainage of rectal abscess, fistulotomy. His symptoms improved remarkably. White count down to 7.5 at discharge. Blood cultures were positive: one bottle coag negative staph and the second bottle for Streptococcus group C. Pt to complete 10 days of antibiotic therapy with Augmentin.  During the hospitalization he also had newly diagnosed type 2 diabetes mellitus. During the hospitalization he was placed on basal insulin as well as mealtime sliding scale coverage. Started on metformin 500 mg twice a day. He received diabetic teaching. Was told to check glucose every morning before  breakfast and schedule outpatient follow-up.  During the hospitalization also had acute kidney injury. Responded well with IV fluids and discharge creatinine went down to 1.08.  Rectal abscess with anal fistula---- Follow up with outpatient surgery clinic in 2 weeks. Sitz baths as needed, avoid constipation, dry dressings as needed.  He is discharged with the following medications Augmentin, metformin 500 mg twice a day, blood glucose meter kit and supplies and Miralax.    TODAY: He reports that he has been taking the metformin 500 mg twice a day. Having no adverse effects. No loose stools or diarrhea. No abdominal pain. He does bring in log book where he has been documenting fasting morning blood sugars every morning. The following are in order starting February 20 and are daily through March 19.: 221, 173, 215, 193, 177, 162, 174, 191, 151, 158, 184, 177, 169, 165, 151, 151, 176, 152, 155, 181, 157, 163, 159, 133, 151, 176, 157, 142.  He reports that he travels for work every week-- covers the entire [place] he is usually out of town Tuesdays through Thursdays and stays in hotels.  However he had already established an exercise routine even with his travel schedule-- will walk on the treadmill in a hotel or if it is nice weather will walk outside in the evenings even when he is traveling with his work. Sayshat he already had worked on his diet but since this hospitalization and diagnosis of diabetes, has been decreasing his carbohydrates further --but there was no specific thing that he was really doing poorly with in regards to his diet this was already pretty good.  Has appointment to follow-up with the surgeon  tomorrow. Has no specific concerns to address today.   Past Medical History:  Diagnosis Date  . Diabetes mellitus without complication ([facility]      Home Meds: Outpatient Medications Prior to Visit  Medication Sig Dispense Refill  . blood glucose meter kit and supplies  Dispense based on patient and insurance preference. Use up to four times daily as directed. (FOR ICD-9 250.00, 250.01). 1 each 0  . glucose blood (ONE TOUCH ULTRA TEST) test strip Use as instructed 100 each 12  . Lancets (FREESTYLE) lancets Use as instructed 100 each 12  . polyethylene glycol (MIRALAX) packet Take 17 g by mouth daily. 14 each 0  . metFORMIN (GLUCOPHAGE) 500 MG tablet Take 1 tablet (500 mg total) by mouth 2 (two) times daily with a meal. 60 tablet 0  . amoxicillin-clavulanate (AUGMENTIN) 875-125 MG tablet Take 1 tablet by mouth 2 (two) times daily. 16 tablet 0  . Pseudoeph-Doxylamine-DM-APAP (NYQUIL PO) Take 30 mLs by mouth at bedtime as needed (cold symptoms).     No facility-administered medications prior to visit.     Allergies: No Known Allergies  Social History   Social History  . Marital status: Married    Spouse name: N/A  . Number of children: N/A  . Years of education: N/A   Occupational History  . Not on file.   Social History Main Topics  . Smoking status: Never Smoker  . Smokeless tobacco: Never Used  . Alcohol use Yes     Comment: Social  . Drug use: No  . Sexual activity: Not on file   Other Topics Concern  . Not on file   Social History Narrative  . No narrative on file    Family History  Problem Relation Age of Onset  . Diabetes Mother   . Diabetes Father      Review of Systems:  See HPI for pertinent ROS. All other ROS negative.    Physical Exam: Blood pressure 134/80, pulse 79, temperature 98.1 F (36.7 C), temperature source Oral, resp. rate 18, weight 213 lb 12.8 oz (97 kg), SpO2 98 %., Body mass index is 30.68 kg/m. General: WNWD AAM. Appears in no acute distress. Neck: Supple. No thyromegaly. No lymphadenopathy. No carotid bruits.  Lungs: Clear bilaterally to auscultation without wheezes, rales, or rhonchi. Breathing is unlabored. Heart: RRR with S1 S2. No murmurs, rubs, or gallops. Abdomen: Soft, non-tender, non-distended  with normoactive bowel sounds. No hepatomegaly. No rebound/guarding. No obvious abdominal masses. Musculoskeletal:  Strength and tone normal for age. Extremities/Skin: Warm and dry.  Neuro: Alert and oriented X 3. Moves all extremities spontaneously. Gait is normal. CNII-XII grossly in tact. Psych:  Responds to questions appropriately with a normal affect.     ASSESSMENT AND PLAN:  61 y.o. year old male with   1. Type 2 diabetes mellitus without complication, without long-term current use of insulin (HCC) Reviewed his blood sugars that he brought in today and reported in the history of present illness. At this time I will have him increase his metformin to 1000 mg twice a day. - metFORMIN (GLUCOPHAGE) 1000 MG tablet; Take 1 tablet (1,000 mg total) by mouth 2 (two) times daily with a meal.  Dispense: 60 tablet; Refill: 3  2. Hospital discharge follow-up I have reviewed hospital discharge summary.  3. Encounter to establish care  4. Perirectal abscess Follow-up with surgeon tomorrow.  5. s/p Sepsis, due to unspecified organism [facility])  Reviewed his chart and the fact that he had no  lipid panel checked. Need to check a lipid panel. He states that he can easily return on Monday mornings fasting. Also discussed that we need to update his preventive care and discussed scheduling a  Complete physical and he is agreeable. He is scheduling a follow-up visit on a Monday morning and will come fasting and will do physical exam at that time. At future visit will also discuss adding aspirin 81 mg once he is further out from his surgery. I will consider adding low-dose ACE inhibitor. His lipids regarding adding statin. Each her visit will also discuss diabetic foot care and diabetic eye exam.   Signed, Karis Juba, [place], [facility] 03/26/2016 3:58 PM

## 2016-04-23 ENCOUNTER — Encounter: Payer: Self-pay | Admitting: Physician Assistant

## 2016-04-23 ENCOUNTER — Ambulatory Visit (INDEPENDENT_AMBULATORY_CARE_PROVIDER_SITE_OTHER): Payer: 59 | Admitting: Physician Assistant

## 2016-04-23 ENCOUNTER — Other Ambulatory Visit: Payer: Self-pay | Admitting: Physician Assistant

## 2016-04-23 VITALS — BP 146/100 | HR 75 | Temp 98.0°F | Resp 16 | Wt 213.2 lb

## 2016-04-23 DIAGNOSIS — E119 Type 2 diabetes mellitus without complications: Secondary | ICD-10-CM

## 2016-04-23 DIAGNOSIS — I10 Essential (primary) hypertension: Secondary | ICD-10-CM

## 2016-04-23 DIAGNOSIS — Z0000 Encounter for general adult medical examination without abnormal findings: Secondary | ICD-10-CM | POA: Diagnosis not present

## 2016-04-23 MED ORDER — ASPIRIN EC 81 MG PO TBEC: 81.0000 mg | DELAYED_RELEASE_TABLET | Freq: Every day | ORAL | 2 refills | Status: DC

## 2016-04-23 MED ORDER — LISINOPRIL 10 MG PO TABS: 10.0000 mg | ORAL_TABLET | Freq: Every day | ORAL | 1 refills | Status: DC

## 2016-04-23 NOTE — Progress Notes (Signed)
Patient ID: Paul Nichols MRN: [id number], DOB: 1955/11/13 61 y.o. Date of Encounter: 04/23/2016, 8:38 AM    Chief Complaint: Physical (CPE)  HPI: 61 y.o. y/o male here for CPE.   He recently had OV with me as New Pt to Establish Care---that OV was on 03/26/2016. He was hospitalized 02/23/16-02/26/16. At that time he was developing a perirectal abscess. At that time he was also newly diagnosed with diabetes. Prior to that, he had not seen any type of medical provider in many years. He reported that both of his parents have diabetes.  Had OV with me 03/26/2016. At that [place], was agreeable to return fasting for CPE.   Presents for CPE today. Fasting. No concerns to address.  Review of Systems: Consitutional: No fever, chills, fatigue, night sweats, lymphadenopathy, or weight changes. Eyes: No visual changes, eye redness, or discharge. ENT/Mouth: Ears: No otalgia, tinnitus, hearing loss, discharge. Nose: No congestion, rhinorrhea, sinus pain, or epistaxis. Throat: No sore throat, post nasal drip, or teeth pain. Cardiovascular: No CP, palpitations, diaphoresis, DOE, edema, orthopnea, PND. Respiratory: No cough, hemoptysis, SOB, or wheezing. Gastrointestinal: No anorexia, dysphagia, reflux, pain, nausea, vomiting, hematemesis, diarrhea, constipation, BRBPR, or melena. Genitourinary: No dysuria, frequency, urgency, hematuria, incontinence, nocturia, decreased urinary stream, discharge, impotence, or testicular pain/masses. Musculoskeletal: No decreased ROM, myalgias, stiffness, joint swelling, or weakness. Skin: No rash, erythema, lesion changes, pain, warmth, jaundice, or pruritis. Neurological: No headache, dizziness, syncope, seizures, tremors, memory loss, coordination problems, or paresthesias. Psychological: No anxiety, depression, hallucinations, SI/HI. Endocrine: No increased fatigue. All other systems were reviewed and are otherwise negative.  Past Medical History:  Diagnosis Date    . Diabetes mellitus without complication [facility])      Past Surgical History:  Procedure Laterality Date  . INCISION AND DRAINAGE PERIRECTAL ABSCESS N/A 02/24/2016   Procedure: IRRIGATION AND DEBRIDEMENT PERIRECTAL ABSCESS AND ANAL FISTULA;  Surgeon: Jackolyn Confer, MD;  Location: [facility];  Service: General;  Laterality: N/A;  . thumb surgery     in high school , hx injury playing football    Home Meds:  Outpatient Medications Prior to Visit  Medication Sig Dispense Refill  . blood glucose meter kit and supplies Dispense based on patient and insurance preference. Use up to four times daily as directed. (FOR ICD-9 250.00, 250.01). 1 each 0  . glucose blood (ONE TOUCH ULTRA TEST) test strip Use as instructed 100 each 12  . Lancets (FREESTYLE) lancets Use as instructed 100 each 12  . metFORMIN (GLUCOPHAGE) 1000 MG tablet Take 1 tablet (1,000 mg total) by mouth 2 (two) times daily with a meal. 60 tablet 3  . polyethylene glycol (MIRALAX) packet Take 17 g by mouth daily. 14 each 0   No facility-administered medications prior to visit.     Allergies: No Known Allergies  Social History   Social History  . Marital status: Married    Spouse name: N/A  . Number of children: N/A  . Years of education: N/A   Occupational History  . Not on file.   Social History Main Topics  . Smoking status: Never Smoker  . Smokeless tobacco: Never Used  . Alcohol use Yes     Comment: Social  . Drug use: No  . Sexual activity: Not on file   Other Topics Concern  . Not on file   Social History Narrative  . No narrative on file    Family History  Problem Relation Age of Onset  . Diabetes Mother   .  Diabetes Father     Physical Exam: Blood pressure (!) 146/100, pulse 75, temperature 98 F (36.7 C), temperature source Oral, resp. rate 16, weight 213 lb 3.2 oz (96.7 kg), SpO2 98 %.  General: Well developed, well nourished AAM. Appears in no acute distress. HEENT: Normocephalic, atraumatic.  Conjunctiva pink, sclera non-icteric. Pupils 2 mm constricting to 1 mm, round, regular, and equally reactive to light and accomodation. EOMI. Internal auditory canal clear. TMs with good cone of light and without pathology. Nasal mucosa pink. Nares are without discharge. No sinus tenderness. Oral mucosa pink. Dentition good. Pharynx without exudate.   Neck: Supple. Trachea midline. No thyromegaly. Full ROM. No lymphadenopathy. No carotid bruit. Lungs: Clear to auscultation bilaterally without wheezes, rales, or rhonchi. Breathing is of normal effort and unlabored. Cardiovascular: RRR with S1 S2. No murmurs, rubs, or gallops. Distal pulses 2+ symmetrically. No carotid or abdominal bruits. Abdomen: Soft, non-tender, non-distended with normoactive bowel sounds. No hepatosplenomegaly or masses. No rebound/guarding. No CVA tenderness. No hernias. Rectal: Deferred. Check PSA today. Going to GI. Musculoskeletal: Full range of motion and 5/5 strength throughout. Skin: Warm and moist without erythema, ecchymosis, wounds, or rash. Neuro: A+Ox3. CN II-XII grossly intact. Moves all extremities spontaneously. Full sensation throughout. Normal gait. Psych:  Responds to questions appropriately with a normal affect.   Assessment/Plan:  61 y.o. y/o AA male here for CPE  1. Encounter for preventive care  A. Screening Labs: - CBC with Differential/Platelet - COMPLETE METABOLIC PANEL WITH GFR - Lipid panel - TSH - VITAMIN D 25 Hydroxy (Vit-D Deficiency, Fractures) - PSA  B. Screening For Prostate Cancer: - PSA  C. Screening For Colorectal Cancer:  - Ambulatory referral to Gastroenterology  D. Immunizations: Flu---------------------------------N/A Tetanus-----------------------------Says he received this 2017 Pneumococcal----------------No indication to require this until age 60 Shingrix----------------------I discussed this and wrote it on his AVS---He is to call his insurance, find out about cost, and  let me know----     2. Type 2 diabetes mellitus without complication, without long-term current use of insulin (HCC) Will add ACE Inh and ASA 59m. - lisinopril (PRINIVIL,ZESTRIL) 10 MG tablet; Take 1 tablet (10 mg total) by mouth daily.  Dispense: 30 tablet; Refill: 1 - aspirin EC 81 MG tablet; Take 1 tablet (81 mg total) by mouth daily.  Dispense: 90 tablet; Refill: 2  3. Essential hypertension - lisinopril (PRINIVIL,ZESTRIL) 10 MG tablet; Take 1 tablet (10 mg total) by mouth daily.  Dispense: 30 tablet; Refill: 1  He will be due to check an A1c in one month.  He will schedule follow-up visit with me for one month.  At that time will check A1c, BMET, BP on ACE and discuss diabetic foot care and diabetic eye exam.  Shingrix----------------------I discussed this and wrote it on his AVS---He is to call his insurance, find out about cost, and let me know----   Signed:   [id number] 04/23/2016 8:38 AM

## 2016-04-24 LAB — PSA: PSA: 0.8 ng/mL (ref <=4.0)

## 2016-04-24 LAB — CBC WITH DIFFERENTIAL/PLATELET
BASOS PCT: 1 %
Basophils Absolute: 38 cells/uL (ref 0–200)
Eosinophils Absolute: 38 cells/uL (ref 15–500)
Eosinophils Relative: 1 %
HCT: 41.8 % (ref 38.5–50.0)
Hemoglobin: 13 g/dL (ref 13.0–17.0)
LYMPHS PCT: 48 %
Lymphs Abs: 1824 cells/uL (ref 850–3900)
MCH: 23 pg — AB (ref 27.0–33.0)
MCHC: 31.1 g/dL — AB (ref 32.0–36.0)
MCV: 74 fL — AB (ref 80.0–100.0)
MONO ABS: 418 {cells}/uL (ref 200–950)
MONOS PCT: 11 %
MPV: 10.7 fL (ref 7.5–12.5)
Neutro Abs: 1482 cells/uL — ABNORMAL LOW (ref 1500–7800)
Neutrophils Relative %: 39 %
PLATELETS: 263 10*3/uL (ref 140–400)
RBC: 5.65 MIL/uL (ref 4.20–5.80)
RDW: 18.9 % — AB (ref 11.0–15.0)
WBC: 3.8 10*3/uL (ref 3.8–10.8)

## 2016-04-24 LAB — LIPID PANEL
CHOLESTEROL: 158 mg/dL (ref <200)
HDL: 36 mg/dL — AB (ref >40)
LDL CALC: 91 mg/dL (ref <100)
TRIGLYCERIDES: 156 mg/dL — AB (ref <150)
Total CHOL/HDL Ratio: 4.4 Ratio (ref <5.0)
VLDL: 31 mg/dL — AB (ref <30)

## 2016-04-24 LAB — TSH: TSH: 1.2 mIU/L (ref 0.40–4.50)

## 2016-04-24 LAB — COMPLETE METABOLIC PANEL WITH GFR
ALT: 17 U/L (ref 9–46)
AST: 19 U/L (ref 10–35)
Albumin: 4.3 g/dL (ref 3.6–5.1)
Alkaline Phosphatase: 45 U/L (ref 40–115)
BUN: 16 mg/dL (ref 7–25)
CALCIUM: 9.4 mg/dL (ref 8.6–10.3)
CHLORIDE: 105 mmol/L (ref 98–110)
CO2: 22 mmol/L (ref 20–31)
Creat: 1.11 mg/dL (ref 0.70–1.33)
GFR, EST AFRICAN AMERICAN: 84 mL/min (ref >=60)
GFR, EST NON AFRICAN AMERICAN: 73 mL/min (ref >=60)
Glucose, Bld: 133 mg/dL — ABNORMAL HIGH (ref 70–99)
Potassium: 4.4 mmol/L (ref 3.5–5.3)
Sodium: 140 mmol/L (ref 135–146)
Total Bilirubin: 0.4 mg/dL (ref 0.2–1.2)
Total Protein: 7.4 g/dL (ref 6.1–8.1)

## 2016-04-24 LAB — VITAMIN D 25 HYDROXY (VIT D DEFICIENCY, FRACTURES): Vit D, 25-Hydroxy: 28 ng/mL — ABNORMAL LOW (ref 30–100)

## 2016-04-26 LAB — IRON AND TIBC
%SAT: 41 % (ref 15–60)
IRON: 128 ug/dL (ref 50–180)
TIBC: 311 ug/dL (ref 250–425)
UIBC: 183 ug/dL (ref 125–400)

## 2016-05-22 ENCOUNTER — Other Ambulatory Visit: Payer: Self-pay

## 2016-05-22 DIAGNOSIS — E119 Type 2 diabetes mellitus without complications: Secondary | ICD-10-CM

## 2016-05-22 DIAGNOSIS — I10 Essential (primary) hypertension: Secondary | ICD-10-CM

## 2016-05-22 MED ORDER — METFORMIN HCL 1000 MG PO TABS: 1000.0000 mg | ORAL_TABLET | Freq: Two times a day (BID) | ORAL | 0 refills | Status: DC

## 2016-05-22 MED ORDER — LISINOPRIL 10 MG PO TABS: 10.0000 mg | ORAL_TABLET | Freq: Every day | ORAL | 0 refills | Status: DC

## 2016-05-22 NOTE — Telephone Encounter (Signed)
Pharmacy sent over a fax requesting 90 day supply of metformin and lisinopril for insurance purpose. new Rx sent in

## 2016-05-23 ENCOUNTER — Encounter: Payer: Self-pay | Admitting: Physician Assistant

## 2016-05-28 ENCOUNTER — Encounter: Payer: Self-pay | Admitting: Physician Assistant

## 2016-05-28 ENCOUNTER — Ambulatory Visit (INDEPENDENT_AMBULATORY_CARE_PROVIDER_SITE_OTHER): Payer: 59 | Admitting: Physician Assistant

## 2016-05-28 VITALS — BP 132/94 | HR 72 | Temp 98.5°F | Resp 18 | Wt 210.2 lb

## 2016-05-28 DIAGNOSIS — I10 Essential (primary) hypertension: Secondary | ICD-10-CM

## 2016-05-28 DIAGNOSIS — E119 Type 2 diabetes mellitus without complications: Secondary | ICD-10-CM

## 2016-05-28 DIAGNOSIS — Z23 Encounter for immunization: Secondary | ICD-10-CM | POA: Diagnosis not present

## 2016-05-28 NOTE — Addendum Note (Signed)
Addended by: Phineas SemenJOHNSON, Shemeika Starzyk A on: 05/28/2016 12:34 PM   Modules accepted: Orders

## 2016-05-28 NOTE — Progress Notes (Signed)
Patient ID: Paul Nichols MRN: [id number], DOB: [birth year] 61 y.o. Date of Encounter: 05/28/2016, 8:11 AM    Chief Complaint: Routine OV, f/u DM, HTN  HPI: 61 y.o. y/o male here for routine OV.Marland Kitchen   He recently had OV with me as New Pt to Establish Care---that OV was on 03/26/2016. He was hospitalized 02/23/16-02/26/16. At that time he was developing a perirectal abscess. At that time he was also newly diagnosed with diabetes. Prior to that, he had not seen any type of medical provider in many years. He reported that both of his parents have diabetes.  Had OV with me 03/26/2016. At that [place], was agreeable to return fasting for CPE.   Returned 04/23/2016 for CPE. Fasting. No concerns to address.  At OV 04/23/2016--added Lisinopril 65m QD.   05/28/2016: Today he states that he did add the lisinopril and is taking this daily. He is having no adverse effects. Also is taking the aspirin 81 mg daily. Continues to take the metformin as directed. No adverse effects with this. He states he did check with his insurance and they do cover shingle wrecks so he is agreeable to get this. No concerns/complaints to discuss today.   Review of Systems: Consitutional: No fever, chills, fatigue, night sweats, lymphadenopathy, or weight changes. Eyes: No visual changes, eye redness, or discharge. ENT/Mouth: Ears: No otalgia, tinnitus, hearing loss, discharge. Nose: No congestion, rhinorrhea, sinus pain, or epistaxis. Throat: No sore throat, post nasal drip, or teeth pain. Cardiovascular: No CP, palpitations, diaphoresis, DOE, edema, orthopnea, PND. Respiratory: No cough, hemoptysis, SOB, or wheezing. Gastrointestinal: No anorexia, dysphagia, reflux, pain, nausea, vomiting, hematemesis, diarrhea, constipation, BRBPR, or melena. Genitourinary: No dysuria, frequency, urgency, hematuria, incontinence, nocturia, decreased urinary stream, discharge, impotence, or testicular pain/masses. Musculoskeletal: No  decreased ROM, myalgias, stiffness, joint swelling, or weakness. Skin: No rash, erythema, lesion changes, pain, warmth, jaundice, or pruritis. Neurological: No headache, dizziness, syncope, seizures, tremors, memory loss, coordination problems, or paresthesias. Psychological: No anxiety, depression, hallucinations, SI/HI. Endocrine: No increased fatigue. All other systems were reviewed and are otherwise negative.  Past Medical History:  Diagnosis Date  . Diabetes mellitus without complication ([facility]      Past Surgical History:  Procedure Laterality Date  . INCISION AND DRAINAGE PERIRECTAL ABSCESS N/A 02/24/2016   Procedure: IRRIGATION AND DEBRIDEMENT PERIRECTAL ABSCESS AND ANAL FISTULA;  Surgeon: TJackolyn Confer MD;  Location: [facility];  Service: General;  Laterality: N/A;  . thumb surgery     in high school , hx injury playing football    Home Meds:  Outpatient Medications Prior to Visit  Medication Sig Dispense Refill  . aspirin EC 81 MG tablet Take 1 tablet (81 mg total) by mouth daily. 90 tablet 2  . blood glucose meter kit and supplies Dispense based on patient and insurance preference. Use up to four times daily as directed. (FOR ICD-9 250.00, 250.01). 1 each 0  . glucose blood (ONE TOUCH ULTRA TEST) test strip Use as instructed 100 each 12  . Lancets (FREESTYLE) lancets Use as instructed 100 each 12  . lisinopril (PRINIVIL,ZESTRIL) 10 MG tablet Take 1 tablet (10 mg total) by mouth daily. 90 tablet 0  . metFORMIN (GLUCOPHAGE) 1000 MG tablet Take 1 tablet (1,000 mg total) by mouth 2 (two) times daily with a meal. 180 tablet 0   No facility-administered medications prior to visit.     Allergies: No Known Allergies  Social History   Social History  . Marital status: Married  Spouse name: N/A  . Number of children: N/A  . Years of education: N/A   Occupational History  . Not on file.   Social History Main Topics  . Smoking status: Never Smoker  . Smokeless tobacco:  Never Used  . Alcohol use Yes     Comment: Social  . Drug use: No  . Sexual activity: Not on file   Other Topics Concern  . Not on file   Social History Narrative  . No narrative on file    Family History  Problem Relation Age of Onset  . Diabetes Mother   . Breast cancer Mother   . Hypertension Mother   . Diabetes Father   . Lung cancer Father        smoker    Physical Exam: Blood pressure (!) 132/94, pulse 72, temperature 98.5 F (36.9 C), temperature source Oral, resp. rate 18, weight 210 lb 3.2 oz (95.3 kg).  General: Well developed, well nourished AAM. Appears in no acute distress. Neck: Supple. Trachea midline. No thyromegaly. Full ROM. No lymphadenopathy. No carotid bruit. Lungs: Clear to auscultation bilaterally without wheezes, rales, or rhonchi. Breathing is of normal effort and unlabored. Cardiovascular: RRR with S1 S2. No murmurs, rubs, or gallops. Distal pulses 2+ symmetrically. No carotid or abdominal bruits. Abdomen: Soft, non-tender, non-distended with normoactive bowel sounds. No hepatosplenomegaly or masses. No rebound/guarding. No CVA tenderness. No hernias. Musculoskeletal: Full range of motion and 5/5 strength throughout. Skin: Warm and moist without erythema, ecchymosis, wounds, or rash. Diabetic Foot Exam: Inspection normal. 2+ DP, PT pulses bilaterally. Neuro: A+Ox3. CN II-XII grossly intact. Moves all extremities spontaneously. Full sensation throughout. Normal gait. Psych:  Responds to questions appropriately with a normal affect.   Assessment/Plan:  61 y.o. y/o AA male here for   Type 2 diabetes mellitus without complication, without long-term current use of insulin (HCC) 04/23/2016---Added ACE Inh (lisinopril 107m QD) and ASA 831m 05/28/2016--Check A1C 05/28/2016----Discussed proper diabetic foot care.  -----------------Discussed Diabetic Eye Exam---He says he already has annual eye exam--told him to inform them of his dx of  Diabetes 05/28/2016---LDL was 91 on FLP 04/23/2016----no statin added----will monitor---in future, if LDL increases at all, or does not decrease--then will add low dose statin.   Essential hypertension 04/23/2016--Added - lisinopril 1051mD 05/28/2016--Cont current dose. Check BMET to monitor.  Shingrix # 1--Given 05/28/2016 ---------------------GIVE SHINGRIX #2 -- in 2 - 6 months----WILL GIVE AT NEXT OV IN 3 MONTHS--------------------------------------  Routine OV 3 months, sooner if needed  ---------THE FOLLOWING IS COPIED FROM CPE NOTE 04/23/2016----------------  Encounter for preventive care  A. Screening Labs: - CBC with Differential/Platelet - COMPLETE METABOLIC PANEL WITH GFR - Lipid panel - TSH - VITAMIN D 25 Hydroxy (Vit-D Deficiency, Fractures) - PSA  B. Screening For Prostate Cancer: - PSA  C. Screening For Colorectal Cancer:  - Ambulatory referral to Gastroenterology  D. Immunizations: Flu---------------------------------N/A Tetanus-----------------------------Says he received this 2017 Pneumococcal----------------No indication to require this until age 40 68ingrix----------------------I discussed this and wrote it on his AVS---He is to call his insurance, find out about cost, and let me know---- ---------------------------------- he did check this --- his insurance does cover shingrix--- he is agreeable to get this. First dose given at visit 05/28/16. Will give second/last dose in 2-6 months. Will give that dose at his next visit 3 months.     Signed:   [address]/21/2018 8:11 AM

## 2016-05-28 NOTE — Addendum Note (Signed)
Addended by: Donne AnonPLUMMER, Kahleah Crass M on: 05/28/2016 12:24 PM   Modules accepted: Orders

## 2016-05-29 ENCOUNTER — Other Ambulatory Visit: Payer: Self-pay

## 2016-05-29 LAB — BASIC METABOLIC PANEL WITH GFR
BUN: 22 mg/dL (ref 7–25)
CO2: 23 mmol/L (ref 20–31)
CREATININE: 1.17 mg/dL (ref 0.70–1.33)
Calcium: 9.7 mg/dL (ref 8.6–10.3)
Chloride: 105 mmol/L (ref 98–110)
GFR, EST AFRICAN AMERICAN: 79 mL/min (ref >=60)
GFR, Est Non African American: 68 mL/min (ref >=60)
GLUCOSE: 128 mg/dL — AB (ref 70–99)
Potassium: 4.5 mmol/L (ref 3.5–5.3)
Sodium: 141 mmol/L (ref 135–146)

## 2016-05-29 LAB — HEMOGLOBIN A1C
HEMOGLOBIN A1C: 7.4 % — AB (ref <5.7)
Mean Plasma Glucose: 166 mg/dL

## 2016-05-29 MED ORDER — SITAGLIPTIN PHOSPHATE 100 MG PO TABS: 100.0000 mg | ORAL_TABLET | ORAL | 3 refills | Status: DC

## 2016-08-07 ENCOUNTER — Other Ambulatory Visit: Payer: Self-pay

## 2016-08-07 MED ORDER — SITAGLIPTIN PHOSPHATE 100 MG PO TABS: 100.0000 mg | ORAL_TABLET | ORAL | 1 refills | Status: DC

## 2016-08-07 NOTE — Telephone Encounter (Signed)
Refill appropriate 

## 2016-08-20 ENCOUNTER — Other Ambulatory Visit: Payer: Self-pay | Admitting: Physician Assistant

## 2016-08-20 DIAGNOSIS — I10 Essential (primary) hypertension: Secondary | ICD-10-CM

## 2016-08-20 DIAGNOSIS — E119 Type 2 diabetes mellitus without complications: Secondary | ICD-10-CM

## 2016-08-20 NOTE — Telephone Encounter (Signed)
Refill appropriate 

## 2016-08-27 ENCOUNTER — Ambulatory Visit (INDEPENDENT_AMBULATORY_CARE_PROVIDER_SITE_OTHER): Payer: 59 | Admitting: Physician Assistant

## 2016-08-27 ENCOUNTER — Encounter: Payer: Self-pay | Admitting: Physician Assistant

## 2016-08-27 VITALS — BP 110/70 | HR 80 | Temp 98.0°F | Resp 16 | Ht 70.0 in | Wt 207.6 lb

## 2016-08-27 DIAGNOSIS — Z1159 Encounter for screening for other viral diseases: Secondary | ICD-10-CM

## 2016-08-27 DIAGNOSIS — Z23 Encounter for immunization: Secondary | ICD-10-CM

## 2016-08-27 DIAGNOSIS — Z114 Encounter for screening for human immunodeficiency virus [HIV]: Secondary | ICD-10-CM

## 2016-08-27 DIAGNOSIS — E119 Type 2 diabetes mellitus without complications: Secondary | ICD-10-CM | POA: Diagnosis not present

## 2016-08-27 DIAGNOSIS — I10 Essential (primary) hypertension: Secondary | ICD-10-CM | POA: Diagnosis not present

## 2016-08-27 NOTE — Progress Notes (Signed)
Patient ID: Paul Nichols MRN: [id number], DOB: [birth year] 61 y.o. Date of Encounter: 08/27/2016, 11:41 AM    Chief Complaint: Routine OV, f/u DM, HTN  HPI: 61 y.o. y/o male here for routine OV.Marland Kitchen   He recently had OV with me as New Pt to Establish Care---that OV was on 03/26/2016. He was hospitalized 02/23/16-02/26/16. At that time he was developing a perirectal abscess. At that time he was also newly diagnosed with diabetes. Prior to that, he had not seen any type of medical provider in many years. He reported that both of his parents have diabetes.  Had OV with me 03/26/2016. At that [place], was agreeable to return fasting for CPE.   Returned 04/23/2016 for CPE. Fasting. No concerns to address.  At OV 04/23/2016--added Lisinopril 87m QD.   05/28/2016: Today he states that he did add the lisinopril and is taking this daily. He is having no adverse effects. Also is taking the aspirin 81 mg daily. Continues to take the metformin as directed. No adverse effects with this. He states he did check with his insurance and they do cover shingle wrecks so he is agreeable to get this. No concerns/complaints to discuss today.   08/27/2016: Reviewed lab results from last OV 05/28/2016. At that time A1c was 7.4 and I recommended add Januvia 100 mg daily. CMET was stable. Today he reports that he did add the Januvia and is taking this daily with no adverse effects. He states that he does continue to check blood sugar every morning and since adding the Januvia usually gets fasting sugars mid 80s to 90s "if I am eating right and doing what I'm supposed to / behaving myself ". He continues to take the lisinopril daily. Having no adverse effects. No lightheadedness. He has no specific concerns to address today.    Review of Systems: Consitutional: No fever, chills, fatigue, night sweats, lymphadenopathy, or weight changes. Eyes: No visual changes, eye redness, or discharge. ENT/Mouth: Ears: No otalgia,  tinnitus, hearing loss, discharge. Nose: No congestion, rhinorrhea, sinus pain, or epistaxis. Throat: No sore throat, post nasal drip, or teeth pain. Cardiovascular: No CP, palpitations, diaphoresis, DOE, edema, orthopnea, PND. Respiratory: No cough, hemoptysis, SOB, or wheezing. Gastrointestinal: No anorexia, dysphagia, reflux, pain, nausea, vomiting, hematemesis, diarrhea, constipation, BRBPR, or melena. Genitourinary: No dysuria, frequency, urgency, hematuria, incontinence, nocturia, decreased urinary stream, discharge, impotence, or testicular pain/masses. Musculoskeletal: No decreased ROM, myalgias, stiffness, joint swelling, or weakness. Skin: No rash, erythema, lesion changes, pain, warmth, jaundice, or pruritis. Neurological: No headache, dizziness, syncope, seizures, tremors, memory loss, coordination problems, or paresthesias. Psychological: No anxiety, depression, hallucinations, SI/HI. Endocrine: No increased fatigue. All other systems were reviewed and are otherwise negative.  Past Medical History:  Diagnosis Date  . Diabetes mellitus without complication ([facility]      Past Surgical History:  Procedure Laterality Date  . INCISION AND DRAINAGE PERIRECTAL ABSCESS N/A 02/24/2016   Procedure: IRRIGATION AND DEBRIDEMENT PERIRECTAL ABSCESS AND ANAL FISTULA;  Surgeon: TJackolyn Confer MD;  Location: [facility];  Service: General;  Laterality: N/A;  . thumb surgery     in high school , hx injury playing football    Home Meds:  Outpatient Medications Prior to Visit  Medication Sig Dispense Refill  . aspirin EC 81 MG tablet Take 1 tablet (81 mg total) by mouth daily. 90 tablet 2  . blood glucose meter kit and supplies Dispense based on patient and insurance preference. Use up to four times daily as directed. (FOR ICD-9  250.00, 250.01). 1 each 0  . glucose blood (ONE TOUCH ULTRA TEST) test strip Use as instructed 100 each 12  . Lancets (FREESTYLE) lancets Use as instructed 100 each 12  .  lisinopril (PRINIVIL,ZESTRIL) 10 MG tablet TAKE 1 TABLET BY MOUTH EVERY DAY 90 tablet 0  . metFORMIN (GLUCOPHAGE) 1000 MG tablet TAKE 1 TABLET (1,000 MG TOTAL) BY MOUTH 2 (TWO) TIMES DAILY WITH A MEAL. 180 tablet 0  . sitaGLIPtin (JANUVIA) 100 MG tablet Take 1 tablet (100 mg total) by mouth every morning. 90 tablet 1   No facility-administered medications prior to visit.     Allergies: No Known Allergies  Social History   Social History  . Marital status: Married    Spouse name: N/A  . Number of children: N/A  . Years of education: N/A   Occupational History  . Not on file.   Social History Main Topics  . Smoking status: Never Smoker  . Smokeless tobacco: Never Used  . Alcohol use Yes     Comment: Social  . Drug use: No  . Sexual activity: Not on file   Other Topics Concern  . Not on file   Social History Narrative  . No narrative on file    Family History  Problem Relation Age of Onset  . Diabetes Mother   . Breast cancer Mother   . Hypertension Mother   . Diabetes Father   . Lung cancer Father        smoker    Physical Exam: Blood pressure 110/70, pulse 80, temperature 98 F (36.7 C), temperature source Oral, resp. rate 16, height '5\' 10"'  (1.778 m), weight 207 lb 9.6 oz (94.2 kg), SpO2 98 %.  General: Well developed, well nourished AAM. Appears in no acute distress. Neck: Supple. Trachea midline. No thyromegaly. Full ROM. No lymphadenopathy. No carotid bruit. Lungs: Clear to auscultation bilaterally without wheezes, rales, or rhonchi. Breathing is of normal effort and unlabored. Cardiovascular: RRR with S1 S2. No murmurs, rubs, or gallops. Distal pulses 2+ symmetrically. No carotid or abdominal bruits. Abdomen: Soft, non-tender, non-distended with normoactive bowel sounds. No hepatosplenomegaly or masses. No rebound/guarding. No CVA tenderness. No hernias. Musculoskeletal: Full range of motion and 5/5 strength throughout. Skin: Warm and moist without erythema,  ecchymosis, wounds, or rash. Diabetic Foot Exam: Inspection normal. 2+ DP, PT pulses bilaterally. Neuro: A+Ox3. CN II-XII grossly intact. Moves all extremities spontaneously. Full sensation throughout. Normal gait. Psych:  Responds to questions appropriately with a normal affect.   Assessment/Plan:  61 y.o. y/o AA male here for   Type 2 diabetes mellitus without complication, without long-term current use of insulin (HCC) 04/23/2016---Added ACE Inh (lisinopril 80m QD) and ASA 874m 05/28/2016--Check A1C 05/28/2016----Discussed proper diabetic foot care.  -----------------Discussed Diabetic Eye Exam---He says he already has annual eye exam--told him to inform them of his dx of Diabetes 05/28/2016---LDL was 91 on FLP 04/23/2016----no statin added----will monitor---in future, if LDL increases at all, or does not decrease--then will add low dose statin.  08/27/2016----recheck A1c. Check microalbumin. He reports [facility] and he will continue to have this annually and will have them send copy of their next OV note [place] now.   Essential hypertension 04/23/2016--Added - lisinopril 1017mD 05/28/2016--Cont current dose. Check BMET to monitor. 08/27/2016---BP at goal. On ACE Inh. Lab stable at check 05/2016  Shingrix # 1--Given 05/28/2016 ---------------------GIVE SHINGRIX #2 -- in 2 -  6 months----WILL GIVE AT NEXT OV IN 3 MONTHS-------------------------------------- Shingrix # 2--- AT OV 08/27/2016---THIS IS ON BACK-ORDER--WILL GIVE AT NEXT OV IF IT IS AVAILABLE------------  Need for hepatitis C screening test Check at OV 08/27/2016 - Hepatitis C antibody  Screening for HIV (human immunodeficiency virus) Check at OV 08/27/2016 - HIV antibody  Routine OV 3 months, sooner if needed  ---------THE FOLLOWING IS COPIED FROM CPE NOTE 04/23/2016----------------  Encounter for preventive care  A. Screening Labs: - CBC with  Differential/Platelet - COMPLETE METABOLIC PANEL WITH GFR - Lipid panel - TSH - VITAMIN D 25 Hydroxy (Vit-D Deficiency, Fractures) - PSA  B. Screening For Prostate Cancer: - PSA  C. Screening For Colorectal Cancer:  - Ambulatory referral to Gastroenterology  D. Immunizations: Flu---------------------------------N/A Tetanus-----------------------------Says he received this 2017 Pneumococcal----------------No indication to require this until age 73 Shingrix----------------------I discussed this and wrote it on his AVS---He is to call his insurance, find out about cost, and let me know---- ---------------------------------- he did check this --- his insurance does cover shingrix--- he is agreeable to get this. First dose given at visit 05/28/16. Will give second/last dose in 2-6 months. Will give that dose at his next visit 3 months.     Signed:   [address] [place], [place]  08/27/2016 11:41 AM

## 2016-08-27 NOTE — Addendum Note (Signed)
Addended by: Phineas Semen A on: 08/27/2016 05:13 PM   Modules accepted: Orders

## 2016-08-28 LAB — MICROALBUMIN, URINE: Microalb, Ur: 0.6 mg/dL

## 2016-08-28 LAB — HIV ANTIBODY (ROUTINE TESTING W REFLEX): HIV 1&2 Ab, 4th Generation: NONREACTIVE

## 2016-08-28 LAB — HEPATITIS C ANTIBODY: HCV AB: NONREACTIVE

## 2016-08-28 LAB — HEMOGLOBIN A1C
HEMOGLOBIN A1C: 6.5 % — AB (ref <5.7)
MEAN PLASMA GLUCOSE: 140 mg/dL

## 2016-09-03 ENCOUNTER — Ambulatory Visit: Payer: 59 | Admitting: Physician Assistant

## 2016-09-07 ENCOUNTER — Ambulatory Visit (INDEPENDENT_AMBULATORY_CARE_PROVIDER_SITE_OTHER): Payer: 59 | Admitting: Family Medicine

## 2016-09-07 DIAGNOSIS — Z23 Encounter for immunization: Secondary | ICD-10-CM | POA: Diagnosis not present

## 2016-10-22 ENCOUNTER — Other Ambulatory Visit: Payer: Self-pay | Admitting: Physician Assistant

## 2016-10-22 DIAGNOSIS — E119 Type 2 diabetes mellitus without complications: Secondary | ICD-10-CM

## 2016-11-17 ENCOUNTER — Other Ambulatory Visit: Payer: Self-pay | Admitting: Physician Assistant

## 2016-11-17 DIAGNOSIS — E119 Type 2 diabetes mellitus without complications: Secondary | ICD-10-CM

## 2016-11-17 DIAGNOSIS — I10 Essential (primary) hypertension: Secondary | ICD-10-CM

## 2016-11-19 NOTE — Telephone Encounter (Signed)
Refill appropriate 

## 2016-11-26 ENCOUNTER — Ambulatory Visit: Payer: 59 | Admitting: Physician Assistant

## 2016-11-28 ENCOUNTER — Ambulatory Visit: Payer: 59 | Admitting: Physician Assistant

## 2016-12-03 ENCOUNTER — Other Ambulatory Visit: Payer: Self-pay

## 2016-12-03 ENCOUNTER — Encounter: Payer: Self-pay | Admitting: Physician Assistant

## 2016-12-03 ENCOUNTER — Ambulatory Visit (INDEPENDENT_AMBULATORY_CARE_PROVIDER_SITE_OTHER): Payer: 59 | Admitting: Physician Assistant

## 2016-12-03 ENCOUNTER — Encounter: Payer: Self-pay | Admitting: Gastroenterology

## 2016-12-03 VITALS — BP 108/70 | HR 75 | Temp 97.8°F | Resp 14 | Ht 70.0 in | Wt 206.8 lb

## 2016-12-03 DIAGNOSIS — Z1212 Encounter for screening for malignant neoplasm of rectum: Secondary | ICD-10-CM

## 2016-12-03 DIAGNOSIS — I10 Essential (primary) hypertension: Secondary | ICD-10-CM

## 2016-12-03 DIAGNOSIS — E119 Type 2 diabetes mellitus without complications: Secondary | ICD-10-CM | POA: Diagnosis not present

## 2016-12-03 DIAGNOSIS — Z1211 Encounter for screening for malignant neoplasm of colon: Secondary | ICD-10-CM

## 2016-12-03 DIAGNOSIS — Z23 Encounter for immunization: Secondary | ICD-10-CM | POA: Diagnosis not present

## 2016-12-03 NOTE — Progress Notes (Signed)
Patient ID: Paul Nichols MRN: [id number], DOB: 1955-09-02 61 y.o. Date of Encounter: 12/03/2016, 2:41 PM    Chief Complaint: Routine OV, f/u DM, HTN  HPI: 61 y.o. y/o male here for routine OV.Marland Kitchen   He recently had OV with me as New Pt to Establish Care---that OV was on 03/26/2016. He was hospitalized 02/23/16-02/26/16. At that time he was developing a perirectal abscess. At that time he was also newly diagnosed with diabetes. Prior to that, he had not seen any type of medical provider in many years. He reported that both of his parents have diabetes.  Had OV with me 03/26/2016. At that Paul Nichols, was agreeable to return fasting for CPE.   Returned 04/23/2016 for CPE. Fasting. No concerns to address.  At OV 04/23/2016--added Lisinopril 7m QD.   05/28/2016: Today he states that he did add the lisinopril and is taking this daily. He is having no adverse effects. Also is taking the aspirin 81 mg daily. Continues to take the metformin as directed. No adverse effects with this. He states he did check with his insurance and they do cover shingle wrecks so he is agreeable to get this. No concerns/complaints to discuss today.   08/27/2016: Reviewed lab results from last OV 05/28/2016. At that time A1c was 7.4 and I recommended add Januvia 100 mg daily. CMET was stable. Today he reports that he did add the Januvia and is taking this daily with no adverse effects. He states that he does continue to check blood sugar every morning and since adding the Januvia usually gets fasting sugars mid 80s to 90s "if I am eating right and doing what I'm supposed to / behaving myself ". He continues to take the lisinopril daily. Having no adverse effects. No lightheadedness. He has no specific concerns to address today.   12/03/2016: His wife, Paul Nichols is also a patient of mine.  She always talks about her kids a lot.  Their [place]  Their daughter is currently in  [employer].  Today patient states that both of them did get to come home for Thanksgiving.  Says that the son actually got snowed in and has had an extra day here, which made his mother very happy !!! Reviewed that at his last visit his labs came back excellent with A1c 6.5. Today patient has no specific concerns to address today. He is taking diabetic medications as directed with no adverse effects. He is taking lisinopril as directed.  No lightheadedness or other adverse effects. Today I did review with him that he did not follow-up with his colonoscopy I had referred him for.  Today he is agreeable to go for this and does want me to go ahead and put in another order for this.   Review of Systems: Consitutional: No fever, chills, fatigue, night sweats, lymphadenopathy, or weight changes. Eyes: No visual changes, eye redness, or discharge. ENT/Mouth: Ears: No otalgia, tinnitus, hearing loss, discharge. Nose: No congestion, rhinorrhea, sinus pain, or epistaxis. Throat: No sore throat, post nasal drip, or teeth pain. Cardiovascular: No CP, palpitations, diaphoresis, DOE, edema, orthopnea, PND. Respiratory: No cough, hemoptysis, SOB, or wheezing. Gastrointestinal: No anorexia, dysphagia, reflux, pain, nausea, vomiting, hematemesis, diarrhea, constipation, BRBPR, or melena. Genitourinary: No dysuria, frequency, urgency, hematuria, incontinence, nocturia, decreased urinary stream, discharge, impotence, or testicular pain/masses. Musculoskeletal: No decreased ROM, myalgias, stiffness, joint swelling, or weakness. Skin: No rash, erythema, lesion changes, pain, warmth, jaundice, or pruritis. Neurological: No  headache, dizziness, syncope, seizures, tremors, memory loss, coordination problems, or paresthesias. Psychological: No anxiety, depression, hallucinations, SI/HI. Endocrine: No increased fatigue. Positive--known diabetes. All other systems were reviewed and are otherwise negative.  Past  Medical History:  Diagnosis Date  . Diabetes mellitus without complication [facility])      Past Surgical History:  Procedure Laterality Date  . INCISION AND DRAINAGE PERIRECTAL ABSCESS N/A 02/24/2016   Procedure: IRRIGATION AND DEBRIDEMENT PERIRECTAL ABSCESS AND ANAL FISTULA;  Surgeon: Jackolyn Confer, MD;  Location: [facility];  Service: General;  Laterality: N/A;  . thumb surgery     in high school , hx injury playing football    Home Meds:  Outpatient Medications Prior to Visit  Medication Sig Dispense Refill  . aspirin 81 MG EC tablet TAKE 1 TABLET (81 MG TOTAL) BY MOUTH DAILY. 90 tablet 2  . blood glucose meter kit and supplies Dispense based on patient and insurance preference. Use up to four times daily as directed. (FOR ICD-9 250.00, 250.01). 1 each 0  . glucose blood (ONE TOUCH ULTRA TEST) test strip Use as instructed 100 each 12  . Lancets (FREESTYLE) lancets Use as instructed 100 each 12  . lisinopril (PRINIVIL,ZESTRIL) 10 MG tablet TAKE 1 TABLET BY MOUTH EVERY DAY 90 tablet 0  . metFORMIN (GLUCOPHAGE) 1000 MG tablet TAKE 1 TABLET (1,000 MG TOTAL) BY MOUTH 2 (TWO) TIMES DAILY WITH A MEAL. 180 tablet 0  . sitaGLIPtin (JANUVIA) 100 MG tablet Take 1 tablet (100 mg total) by mouth every morning. 90 tablet 1   No facility-administered medications prior to visit.     Allergies: No Known Allergies  Social History   Socioeconomic History  . Marital status: Married    Spouse name: Not on file  . Number of children: Not on file  . Years of education: Not on file  . Highest education level: Not on file  Social Needs  . Financial resource strain: Not on file  . Food insecurity - worry: Not on file  . Food insecurity - inability: Not on file  . Transportation needs - medical: Not on file  . Transportation needs - non-medical: Not on file  Occupational History  . Not on file  Tobacco Use  . Smoking status: Never Smoker  . Smokeless tobacco: Never Used  Substance and Sexual Activity   . Alcohol use: Yes    Comment: Social  . Drug use: No  . Sexual activity: Not on file  Other Topics Concern  . Not on file  Social History Narrative  . Not on file    Family History  Problem Relation Age of Onset  . Diabetes Mother   . Breast cancer Mother   . Hypertension Mother   . Diabetes Father   . Lung cancer Father        smoker    Physical Exam: Blood pressure 108/70, pulse 75, temperature 97.8 F (36.6 C), temperature source Oral, resp. rate 14, height '5\' 10"'  (1.778 m), weight 93.8 kg (206 lb 12.8 oz), SpO2 98 %.  General: Well developed, well nourished AAM. Appears in no acute distress. Neck: Supple. Trachea midline. No thyromegaly. Full ROM. No lymphadenopathy. No carotid bruit. Lungs: Clear to auscultation bilaterally without wheezes, rales, or rhonchi. Breathing is of normal effort and unlabored. Cardiovascular: RRR with S1 S2. No murmurs, rubs, or gallops. Distal pulses 2+ symmetrically. No carotid or abdominal bruits. Abdomen: Soft, non-tender, non-distended with normoactive bowel sounds. No hepatosplenomegaly or masses. No rebound/guarding. No CVA tenderness. No  hernias. Musculoskeletal: Full range of motion and 5/5 strength throughout. Skin: Warm and moist without erythema, ecchymosis, wounds, or rash. Diabetic Foot Exam: Inspection normal. 2+ DP, PT pulses bilaterally. Neuro: A+Ox3. CN II-XII grossly intact. Moves all extremities spontaneously. Full sensation throughout. Normal gait. Psych:  Responds to questions appropriately with a normal affect.   Assessment/Plan:  61 y.o. y/o AA male here for   Type 2 diabetes mellitus without complication, without long-term current use of insulin (HCC) 04/23/2016---Added ACE Inh (lisinopril 32m QD) and ASA 837m 05/28/2016--Check A1C 05/28/2016----Discussed proper diabetic foot care.  -----------------Discussed Diabetic Eye Exam---He says he already has annual eye exam--told him to inform them of his dx of  Diabetes 05/28/2016---LDL was 91 on FLP 04/23/2016----no statin added----will monitor---in future, if LDL increases at all, or does not decrease--then will add low dose statin.  08/27/2016----recheck A1c. Check microalbumin. He reports [facility] [address] [facility] and he will continue to have this annually and will have them send copy of their next OV note to [place] 23 now.  12/03/2016----recheck A1c to monitor.  Essential hypertension 04/23/2016--Added - lisinopril 1058mD 05/28/2016--Cont current dose. Check BMET to monitor. 08/27/2016---BP at goal. On ACE Inh. Lab stable at check 05/2016 12/03/2016--- BP at goal.  On ACE inhibitor.  Recheck lab to monitor.  Shingrix # 1--Given 05/28/2016 ---------------------GIVE SHINGRIX #2 -- in 2 - 6 months----WILL GIVE AT NEXT OV IN 3 MONTHS-------------------------------------- Shingrix # 2--- AT OV 08/27/2016---THIS IS ON BACK-ORDER--WILL GIVE AT NEXT OV IF IT IS AVAILABLE------------ Shingrix # 2------- At OV 12/03/2016----- Reviewed that he received # 2 on 09/07/2016-----  Need for hepatitis C screening test Check at OV 08/27/2016 - Hepatitis C antibody  Screening for HIV (human immunodeficiency virus) Check at OV 08/27/2016 - HIV antibody  Routine OV 3 months, sooner if needed  ---------THE FOLLOWING IS COPIED FROM CPE NOTE 04/23/2016----------------  Encounter for preventive care  A. Screening Labs: - CBC with Differential/Platelet - COMPLETE METABOLIC PANEL WITH GFR - Lipid panel - TSH - VITAMIN D 25 Hydroxy (Vit-D Deficiency, Fractures) - PSA  B. Screening For Prostate Cancer: - PSA  C. Screening For Colorectal Cancer:  - Ambulatory referral to Gastroenterology ---Addendum added 12/03/2016--- He did not follow up for his colonoscopy that I had ordered in April.  I discussed this at today's visit and he is agreeable so I have put in another order for GI for colonoscopy.  D.  Immunizations: Flu---------------------------------N/A Tetanus-----------------------------Says he received this 2017 Pneumococcal----------------No indication to require this until age 64 90ingrix----------------------I discussed this and wrote it on his AVS---He is to call his insurance, find out about cost, and let me know---- ---------------------------------- he did check this --- his insurance does cover shingrix--- he is agreeable to get this. First dose given at visit 05/28/16. Will give second/last dose in 2-6 months. Will give that dose at his next visit 3 months.     Signed:   [address]26/2018 2:41 PM

## 2016-12-04 LAB — BASIC METABOLIC PANEL WITH GFR
BUN: 17 mg/dL (ref 7–25)
CHLORIDE: 101 mmol/L (ref 98–110)
CO2: 23 mmol/L (ref 20–32)
Calcium: 9.4 mg/dL (ref 8.6–10.3)
Creat: 1.14 mg/dL (ref 0.70–1.33)
GFR, EST AFRICAN AMERICAN: 81 mL/min/{1.73_m2} (ref >=60)
GFR, Est Non African American: 70 mL/min/{1.73_m2} (ref >=60)
GLUCOSE: 101 mg/dL — AB (ref 65–99)
POTASSIUM: 4.3 mmol/L (ref 3.5–5.3)
Sodium: 137 mmol/L (ref 135–146)

## 2016-12-04 LAB — HEMOGLOBIN A1C
EAG (MMOL/L): 7.4 (calc)
HEMOGLOBIN A1C: 6.3 %{Hb} — AB (ref <5.7)
Mean Plasma Glucose: 134 (calc)

## 2017-01-18 ENCOUNTER — Ambulatory Visit (AMBULATORY_SURGERY_CENTER): Payer: Self-pay | Admitting: *Deleted

## 2017-01-18 ENCOUNTER — Other Ambulatory Visit: Payer: Self-pay

## 2017-01-18 VITALS — Ht 70.0 in | Wt 208.6 lb

## 2017-01-18 DIAGNOSIS — Z1211 Encounter for screening for malignant neoplasm of colon: Secondary | ICD-10-CM

## 2017-01-18 MED ORDER — PEG 3350-KCL-NA BICARB-NACL 420 G PO SOLR: 4000.0000 mL | Freq: Once | ORAL | 0 refills | Status: AC

## 2017-01-18 NOTE — Progress Notes (Signed)
No egg or soy allergy known to patient  No issues with past sedation with any surgeries  or procedures, no intubation problems  No diet pills per patient No home 02 use per patient  No blood thinners per patient  Pt denies issues with constipation  No A fib or A flutter  EMMI video sent to pt's e mail  

## 2017-01-23 ENCOUNTER — Encounter: Payer: Self-pay | Admitting: Gastroenterology

## 2017-02-01 ENCOUNTER — Other Ambulatory Visit: Payer: Self-pay

## 2017-02-01 ENCOUNTER — Encounter: Payer: Self-pay | Admitting: Gastroenterology

## 2017-02-01 ENCOUNTER — Ambulatory Visit (AMBULATORY_SURGERY_CENTER): Payer: 59 | Admitting: Gastroenterology

## 2017-02-01 VITALS — BP 110/72 | HR 71 | Temp 98.6°F | Resp 20 | Ht 70.0 in | Wt 208.0 lb

## 2017-02-01 DIAGNOSIS — Z1211 Encounter for screening for malignant neoplasm of colon: Secondary | ICD-10-CM | POA: Diagnosis present

## 2017-02-01 DIAGNOSIS — Z1212 Encounter for screening for malignant neoplasm of rectum: Secondary | ICD-10-CM

## 2017-02-01 MED ORDER — SODIUM CHLORIDE 0.9 % IV SOLN: 500.0000 mL | INTRAVENOUS | Status: DC

## 2017-02-01 NOTE — Progress Notes (Signed)
To PACU, VSS. Report to RN.tb 

## 2017-02-01 NOTE — Op Note (Signed)
[facility] Patient Name: Paul Nichols Procedure Date: 02/01/2017 7:53 AM MRN: [id number] Endoscopist: Rachael Fee , MD Age: 62 Referring MD:  Date of Birth: 10-25-1955 Gender: Male Account #: [id number] Procedure:                Colonoscopy Indications:              Screening for colorectal malignant neoplasm Medicines:                Monitored Anesthesia Care Procedure:                Pre-Anesthesia Assessment:                           - Prior to the procedure, a History and Physical                            was performed, and patient medications and                            allergies were reviewed. The patient's tolerance of                            previous anesthesia was also reviewed. The risks                            and benefits of the procedure and the sedation                            options and risks were discussed with the patient.                            All questions were answered, and informed consent                            was obtained. Prior Anticoagulants: The patient has                            taken no previous anticoagulant or antiplatelet                            agents. ASA Grade Assessment: II - A patient with                            mild systemic disease. After reviewing the risks                            and benefits, the patient was deemed in                            satisfactory condition to undergo the procedure.                           After obtaining informed consent, the colonoscope  was passed under direct vision. Throughout the                            procedure, the patient's blood pressure, pulse, and                            oxygen saturations were monitored continuously. The                            Colonoscope was introduced through the anus and                            advanced to the the cecum, identified by                            appendiceal orifice and  ileocecal valve. The                            colonoscopy was performed without difficulty. The                            patient tolerated the procedure well. The quality                            of the bowel preparation was excellent. The                            ileocecal valve, appendiceal orifice, and rectum                            were photographed. Scope In: 8:07:52 AM Scope Out: 8:26:43 AM Scope Withdrawal Time: 0 hours 9 minutes 4 seconds  Total Procedure Duration: 0 hours 18 minutes 51 seconds  Findings:                 The entire examined colon appeared normal on direct                            and retroflexion views. Complications:            No immediate complications. Estimated blood loss:                            None. Estimated Blood Loss:     Estimated blood loss: none. Impression:               - The entire examined colon is normal on direct and                            retroflexion views.                           - No polyps or cancers. Recommendation:           - Patient has a contact number available for  emergencies. The signs and symptoms of potential                            delayed complications were discussed with the                            patient. Return to normal activities tomorrow.                            Written discharge instructions were provided to the                            patient.                           - Resume previous diet.                           - Continue present medications.                           - Repeat colonoscopy in 10 years for screening. Rachael Feeaniel P Yuya Vanwingerden, MD 02/01/2017 8:29:12 AM This report has been signed electronically.

## 2017-02-01 NOTE — Patient Instructions (Signed)

## 2017-02-04 ENCOUNTER — Telehealth: Payer: Self-pay

## 2017-02-04 NOTE — Telephone Encounter (Signed)
  Follow up Call-  Call back number 02/01/2017  Post procedure Call Back phone  # [phone]  Permission to leave phone message Yes     Patient questions:  Do you have a fever, pain , or abdominal swelling? No. Pain Score  0 *  Have you tolerated food without any problems? Yes.    Have you been able to return to your normal activities? Yes.    Do you have any questions about your discharge instructions: Diet   No. Medications  No. Follow up visit  No.  Do you have questions or concerns about your Care? No.  Actions: * If pain score is 4 or above: No action needed, pain <4.

## 2017-02-06 ENCOUNTER — Other Ambulatory Visit: Payer: Self-pay | Admitting: Physician Assistant

## 2017-02-06 NOTE — Telephone Encounter (Signed)
Refill appropriate 

## 2017-02-22 ENCOUNTER — Other Ambulatory Visit: Payer: Self-pay | Admitting: Physician Assistant

## 2017-02-22 DIAGNOSIS — I10 Essential (primary) hypertension: Secondary | ICD-10-CM

## 2017-02-22 DIAGNOSIS — E119 Type 2 diabetes mellitus without complications: Secondary | ICD-10-CM

## 2017-03-11 ENCOUNTER — Other Ambulatory Visit: Payer: Self-pay

## 2017-03-11 ENCOUNTER — Encounter: Payer: Self-pay | Admitting: Physician Assistant

## 2017-03-11 ENCOUNTER — Ambulatory Visit (INDEPENDENT_AMBULATORY_CARE_PROVIDER_SITE_OTHER): Payer: 59 | Admitting: Physician Assistant

## 2017-03-11 VITALS — BP 126/80 | HR 87 | Temp 98.3°F | Resp 14 | Wt 203.6 lb

## 2017-03-11 DIAGNOSIS — E785 Hyperlipidemia, unspecified: Secondary | ICD-10-CM

## 2017-03-11 DIAGNOSIS — I10 Essential (primary) hypertension: Secondary | ICD-10-CM

## 2017-03-11 DIAGNOSIS — E119 Type 2 diabetes mellitus without complications: Secondary | ICD-10-CM | POA: Diagnosis not present

## 2017-03-11 NOTE — Progress Notes (Signed)
Patient ID: Paul Nichols MRN: [id number], DOB: 17-Jul-1955 62 y.o. Date of Encounter: 03/11/2017, 8:37 AM    Chief Complaint: Routine OV, f/u DM, HTN  HPI: 62 y.o. y/o male here for routine OV.Marland Kitchen   He recently had OV with me as New Pt to Establish Care---that OV was on 03/26/2016. He was hospitalized 02/23/16-02/26/16. At that time he was developing a perirectal abscess. At that time he was also newly diagnosed with diabetes. Prior to that, he had not seen any type of medical provider in many years. He reported that both of his parents have diabetes.  Had OV with me 03/26/2016. At that [place], was agreeable to return fasting for CPE.   Returned 04/23/2016 for CPE. Fasting. No concerns to address.  At OV 04/23/2016--added Lisinopril 30m QD.   05/28/2016: Today he states that he did add the lisinopril and is taking this daily. He is having no adverse effects. Also is taking the aspirin 81 mg daily. Continues to take the metformin as directed. No adverse effects with this. He states he did check with his insurance and they do cover shingle wrecks so he is agreeable to get this. No concerns/complaints to discuss today.   08/27/2016: Reviewed lab results from last OV 05/28/2016. At that time A1c was 7.4 and I recommended add Januvia 100 mg daily. CMET was stable. Today he reports that he did add the Januvia and is taking this daily with no adverse effects. He states that he does continue to check blood sugar every morning and since adding the Januvia usually gets fasting sugars mid 80s to 90s "if I am eating right and doing what I'm supposed to / behaving myself ". He continues to take the lisinopril daily. Having no adverse effects. No lightheadedness. He has no specific concerns to address today.   12/03/2016: His wife, Paul Nichols is also a patient of mine.  She always talks about her kids a lot.  Their son currently has a [place] in [place]  Their daughter is currently in  [employer].  Today patient states that both of them did get to come home for Thanksgiving.  Says that the son actually got snowed in and has had an extra day here, which made his mother very happy !!! Reviewed that at his last visit his labs came back excellent with A1c 6.5. Today patient has no specific concerns to address today. He is taking diabetic medications as directed with no adverse effects. He is taking lisinopril as directed.  No lightheadedness or other adverse effects. Today I did review with him that he did not follow-up with his colonoscopy I had referred him for.  Today he is agreeable to go for this and does want me to go ahead and put in another order for this.   03/11/2017: He has no specific concerns to address today. He is taking diabetic medications as directed with no adverse effects. He is taking lisinopril as directed.  No lightheadedness or other adverse effects. Today I did discuss with him that more and more research and information is showing that medics should be on statin regardless of their cholesterol readings.  His last lipid panel was 04/2016 with LDL 91 at that time.  He is fasting today.  We will recheck FLP now.  Then will decide on exact dose for a low-dose statin.  Gust this with him today and he is agreeable with this approach. Today I discussed with patient that I saw that he  did go for colonoscopy 02/01/2017--- but I did not read the report.  He states that it was normal and was told he can repeat 10 years.   Review of Systems: Consitutional: No fever, chills, fatigue, night sweats, lymphadenopathy, or weight changes. Eyes: No visual changes, eye redness, or discharge. ENT/Mouth: Ears: No otalgia, tinnitus, hearing loss, discharge. Nose: No congestion, rhinorrhea, sinus pain, or epistaxis. Throat: No sore throat, post nasal drip, or teeth pain. Cardiovascular: No CP, palpitations, diaphoresis, DOE, edema, orthopnea, PND. Respiratory: No cough,  hemoptysis, SOB, or wheezing. Gastrointestinal: No anorexia, dysphagia, reflux, pain, nausea, vomiting, hematemesis, diarrhea, constipation, BRBPR, or melena. Genitourinary: No dysuria, frequency, urgency, hematuria, incontinence, nocturia, decreased urinary stream, discharge, impotence, or testicular pain/masses. Musculoskeletal: No decreased ROM, myalgias, stiffness, joint swelling, or weakness. Skin: No rash, erythema, lesion changes, pain, warmth, jaundice, or pruritis. Neurological: No headache, dizziness, syncope, seizures, tremors, memory loss, coordination problems, or paresthesias. Psychological: No anxiety, depression, hallucinations, SI/HI. Endocrine: No increased fatigue. Positive--known diabetes. All other systems were reviewed and are otherwise negative.  Past Medical History:  Diagnosis Date  . Diabetes mellitus without complication ([place])   . Hypertension      Past Surgical History:  Procedure Laterality Date  . INCISION AND DRAINAGE PERIRECTAL ABSCESS N/A 02/24/2016   Procedure: IRRIGATION AND DEBRIDEMENT PERIRECTAL ABSCESS AND ANAL FISTULA;  Surgeon: Jackolyn Confer, MD;  Location: [facility];  Service: General;  Laterality: N/A;  . thumb surgery     in high school , hx injury playing football    Home Meds:  Outpatient Medications Prior to Visit  Medication Sig Dispense Refill  . aspirin 81 MG EC tablet TAKE 1 TABLET (81 MG TOTAL) BY MOUTH DAILY. 90 tablet 2  . blood glucose meter kit and supplies Dispense based on patient and insurance preference. Use up to four times daily as directed. (FOR ICD-9 250.00, 250.01). 1 each 0  . glucose blood (ONE TOUCH ULTRA TEST) test strip Use as instructed 100 each 12  . JANUVIA 100 MG tablet TAKE 1 TABLET BY MOUTH EVERY MORNING 90 tablet 1  . Lancets (FREESTYLE) lancets Use as instructed 100 each 12  . lisinopril (PRINIVIL,ZESTRIL) 10 MG tablet TAKE 1 TABLET BY MOUTH EVERY DAY 90 tablet 0  . metFORMIN (GLUCOPHAGE) 1000 MG tablet TAKE 1  TABLET (1,000 MG TOTAL) BY MOUTH 2 (TWO) TIMES DAILY WITH A MEAL. 180 tablet 0   Facility-Administered Medications Prior to Visit  Medication Dose Route Frequency Provider Last Rate Last Dose  . 0.9 %  sodium chloride infusion  500 mL Intravenous Continuous Milus Banister, MD        Allergies: No Known Allergies  Social History   Socioeconomic History  . Marital status: Married    Spouse name: Not on file  . Number of children: Not on file  . Years of education: Not on file  . Highest education level: Not on file  Social Needs  . Financial resource strain: Not on file  . Food insecurity - worry: Not on file  . Food insecurity - inability: Not on file  . Transportation needs - medical: Not on file  . Transportation needs - non-medical: Not on file  Occupational History  . Not on file  Tobacco Use  . Smoking status: Former Smoker    Types: Cigars    Last attempt to quit: 01/19/2016    Years since quitting: 1.1  . Smokeless tobacco: Never Used  Substance and Sexual Activity  . Alcohol use: Yes  Comment: Social  . Drug use: No  . Sexual activity: Not on file  Other Topics Concern  . Not on file  Social History Narrative  . Not on file    Family History  Problem Relation Age of Onset  . Diabetes Mother   . Breast cancer Mother   . Hypertension Mother   . Diabetes Father   . Lung cancer Father        smoker  . Liver cancer Father   . Colon cancer Neg Hx   . Colon polyps Neg Hx   . Esophageal cancer Neg Hx   . Rectal cancer Neg Hx   . Stomach cancer Neg Hx     Physical Exam: Blood pressure 126/80, pulse 87, temperature 98.3 F (36.8 C), temperature source Oral, resp. rate 14, weight 92.4 kg (203 lb 9.6 oz), SpO2 98 %.  General: Well developed, well nourished AAM. Appears in no acute distress. Neck: Supple. Trachea midline. No thyromegaly. Full ROM. No lymphadenopathy. No carotid bruit. Lungs: Clear to auscultation bilaterally without wheezes, rales, or  rhonchi. Breathing is of normal effort and unlabored. Cardiovascular: RRR with S1 S2. No murmurs, rubs, or gallops. Distal pulses 2+ symmetrically. No carotid or abdominal bruits. Abdomen: Soft, non-tender, non-distended with normoactive bowel sounds. No hepatosplenomegaly or masses. No rebound/guarding. No CVA tenderness. No hernias. Musculoskeletal: Full range of motion and 5/5 strength throughout. Skin: Warm and moist without erythema, ecchymosis, wounds, or rash. Diabetic Foot Exam: Inspection normal. 2+ DP, PT pulses bilaterally. Neuro: A+Ox3. CN II-XII grossly intact. Moves all extremities spontaneously. Full sensation throughout. Normal gait. Psych:  Responds to questions appropriately with a normal affect.   Assessment/Plan:  62 y.o. y/o AA male here for   Type 2 diabetes mellitus without complication, without long-term current use of insulin ([facility]) 04/23/2016---Added ACE Inh (lisinopril 37m QD) and ASA 860m 05/28/2016--Check A1C 05/28/2016----Discussed proper diabetic foot care.  -----------------Discussed Diabetic Eye Exam---He says he already has annual eye exam--told him to inform them of his dx of Diabetes 05/28/2016---LDL was 91 on FLP 04/23/2016----no statin added----will monitor---in future, if LDL increases at all, or does not decrease--then will add low dose statin.  08/27/2016----recheck A1c. Check microalbumin. He reports that [facility] [address] [facility] and he will continue to have this annually and will have them send copy of their next [place] [facility] now.  12/03/2016----recheck A1c to monitor. 03/11/2017--- today we will check A1c and also will check FLP and LFT.   Today I did discuss with him that more and more research and information is showing that medics should be on statin regardless of their cholesterol readings.  His last lipid panel was 04/2016 with LDL 91 at that time.  He is fasting today.  We will recheck FLP  now.  Then will decide on exact dose for a low-dose statin.  Discussed this with him today and he is agreeable with this approach.  Essential hypertension 04/23/2016--Added - lisinopril 1043mD 05/28/2016--Cont current dose. Check BMET to monitor. 08/27/2016---BP at goal. On ACE Inh. Lab stable at check 05/2016 12/03/2016--- BP at goal.  On ACE inhibitor.  Recheck lab to monitor. 03/11/2017: -- BP at goal.  On ACE inhibitor.  Recheck lab to monitor.  Shingrix # 1--Given 05/28/2016 ---------------------GIVE SHINGRIX #2 -- in 2 - 6 months----WILL GIVE AT NEXT OV IN 3 MONTHS-------------------------------------- Shingrix # 2--- AT OV 08/27/2016---THIS IS ON BACK-ORDER--WILL GIVE AT NEXT OV IF  IT IS AVAILABLE------------ Shingrix # 2------- At OV 12/03/2016----- Reviewed that he received # 2 on 09/07/2016-----    Routine OV 3 months, sooner if needed  ---------THE FOLLOWING IS COPIED FROM CPE NOTE 04/23/2016----------------  Encounter for preventive care  A. Screening Labs: - CBC with Differential/Platelet - COMPLETE METABOLIC PANEL WITH GFR - Lipid panel - TSH - VITAMIN D 25 Hydroxy (Vit-D Deficiency, Fractures) - PSA  B. Screening For Prostate Cancer: - PSA  C. Screening For Colorectal Cancer:  - Ambulatory referral to Gastroenterology ---Addendum added 12/03/2016--- He did not follow up for his colonoscopy that I had ordered in April.  I discussed this at today's visit and he is agreeable so I have put in another order for GI for colonoscopy.  D. Immunizations: Flu---------------------------------N/A Tetanus-----------------------------Says he received this 2017 Pneumococcal----------------No indication to require this until age 76 Shingrix----------------------I discussed this and wrote it on his AVS---He is to call his insurance, find out about cost, and let me know---- ---------------------------------- he did check this --- his insurance does cover shingrix--- he is agreeable to get  this. First dose given at visit 05/28/16. Will give second/last dose in 2-6 months. Will give that dose at his next visit 3 months.     Signed:   [address] [place], [place]  03/11/2017 8:37 AM

## 2017-03-12 ENCOUNTER — Other Ambulatory Visit: Payer: Self-pay

## 2017-03-12 LAB — COMPLETE METABOLIC PANEL WITH GFR
AG RATIO: 1.3 (calc) (ref 1.0–2.5)
ALT: 14 U/L (ref 9–46)
AST: 14 U/L (ref 10–35)
Albumin: 4.3 g/dL (ref 3.6–5.1)
Alkaline phosphatase (APISO): 43 U/L (ref 40–115)
BILIRUBIN TOTAL: 0.3 mg/dL (ref 0.2–1.2)
BUN: 19 mg/dL (ref 7–25)
CHLORIDE: 104 mmol/L (ref 98–110)
CO2: 27 mmol/L (ref 20–32)
Calcium: 9.5 mg/dL (ref 8.6–10.3)
Creat: 1.3 mg/dL (ref 0.70–1.33)
GFR, EST AFRICAN AMERICAN: 69 mL/min/{1.73_m2} (ref >=60)
GFR, Est Non African American: 60 mL/min/{1.73_m2} (ref >=60)
GLOBULIN: 3.2 g/dL (ref 1.9–3.7)
Glucose, Bld: 123 mg/dL — ABNORMAL HIGH (ref 65–99)
POTASSIUM: 4.3 mmol/L (ref 3.5–5.3)
SODIUM: 140 mmol/L (ref 135–146)
TOTAL PROTEIN: 7.5 g/dL (ref 6.1–8.1)

## 2017-03-12 LAB — HEMOGLOBIN A1C
EAG (MMOL/L): 8.1 (calc)
HEMOGLOBIN A1C: 6.7 %{Hb} — AB (ref <5.7)
MEAN PLASMA GLUCOSE: 146 (calc)

## 2017-03-12 LAB — LIPID PANEL
Cholesterol: 165 mg/dL (ref <200)
HDL: 35 mg/dL — ABNORMAL LOW (ref >40)
LDL Cholesterol (Calc): 109 mg/dL (calc) — ABNORMAL HIGH
Non-HDL Cholesterol (Calc): 130 mg/dL (calc) — ABNORMAL HIGH (ref <130)
TRIGLYCERIDES: 100 mg/dL (ref <150)
Total CHOL/HDL Ratio: 4.7 (calc) (ref <5.0)

## 2017-03-12 MED ORDER — SIMVASTATIN 10 MG PO TABS: 10.0000 mg | ORAL_TABLET | Freq: Every day | ORAL | 3 refills | Status: DC

## 2017-05-17 ENCOUNTER — Other Ambulatory Visit: Payer: Self-pay | Admitting: Physician Assistant

## 2017-05-17 DIAGNOSIS — I10 Essential (primary) hypertension: Secondary | ICD-10-CM

## 2017-05-17 DIAGNOSIS — E119 Type 2 diabetes mellitus without complications: Secondary | ICD-10-CM

## 2017-06-06 ENCOUNTER — Other Ambulatory Visit: Payer: Self-pay | Admitting: Physician Assistant

## 2017-06-17 ENCOUNTER — Ambulatory Visit (INDEPENDENT_AMBULATORY_CARE_PROVIDER_SITE_OTHER): Payer: 59 | Admitting: Physician Assistant

## 2017-06-17 ENCOUNTER — Encounter: Payer: Self-pay | Admitting: Physician Assistant

## 2017-06-17 ENCOUNTER — Other Ambulatory Visit: Payer: Self-pay

## 2017-06-17 VITALS — BP 122/80 | HR 72 | Temp 98.1°F | Resp 14 | Ht 70.0 in | Wt 208.4 lb

## 2017-06-17 DIAGNOSIS — E119 Type 2 diabetes mellitus without complications: Secondary | ICD-10-CM | POA: Diagnosis not present

## 2017-06-17 DIAGNOSIS — I10 Essential (primary) hypertension: Secondary | ICD-10-CM | POA: Diagnosis not present

## 2017-06-17 DIAGNOSIS — E785 Hyperlipidemia, unspecified: Secondary | ICD-10-CM | POA: Diagnosis not present

## 2017-06-17 DIAGNOSIS — Z125 Encounter for screening for malignant neoplasm of prostate: Secondary | ICD-10-CM

## 2017-06-17 NOTE — Progress Notes (Signed)
Patient ID: Paul Nichols MRN: [id number], DOB: [birth year] 62 y.o. Date of Encounter: 06/17/2017, 7:57 AM    Chief Complaint: Routine OV, f/u DM, HTN  HPI: 62 y.o. y/o male here for routine OV.Marland Kitchen   He had OV with me as New Pt to Establish Care---that OV was on 03/26/2016. He was hospitalized 02/23/16-02/26/16. At that time he was developing a perirectal abscess. At that time he was also newly diagnosed with diabetes. Prior to that, he had not seen any type of medical provider in many years. He reported that both of his parents have diabetes.  Had OV with me 03/26/2016. At that [place], was agreeable to return fasting for CPE.   Returned 04/23/2016 for CPE. Fasting. No concerns to address.  At OV 04/23/2016--added Lisinopril 32m QD.   05/28/2016: Today he states that he did add the lisinopril and is taking this daily. He is having no adverse effects. Also is taking the aspirin 81 mg daily. Continues to take the metformin as directed. No adverse effects with this. He states he did check with his insurance and they do cover shingle wrecks so he is agreeable to get this. No concerns/complaints to discuss today.   08/27/2016: Reviewed lab results from last OV 05/28/2016. At that time A1c was 7.4 and I recommended add Januvia 100 mg daily. CMET was stable. Today he reports that he did add the Januvia and is taking this daily with no adverse effects. He states that he does continue to check blood sugar every morning and since adding the Januvia usually gets fasting sugars mid 80s to 90s "if I am eating right and doing what I'm supposed to / behaving myself ". He continues to take the lisinopril daily. Having no adverse effects. No lightheadedness. He has no specific concerns to address today.   12/03/2016: His wife, [name] is also a patient of mine.  She always talks about her kids a lot.  Their son currently has a job working in [place]  Their daughter is currently in [employer] nutritionist school.  Today patient states that both of them did get to come home for Thanksgiving.  Says that the son actually got snowed in and has had an extra day here, which made his mother very happy !!! Reviewed that at his last visit his labs came back excellent with A1c 6.5. Today patient has no specific concerns to address today. He is taking diabetic medications as directed with no adverse effects. He is taking lisinopril as directed.  No lightheadedness or other adverse effects. Today I did review with him that he did not follow-up with his colonoscopy I had referred him for.  Today he is agreeable to go for this and does want me to go ahead and put in another order for this.   03/11/2017: He has no specific concerns to address today. He is taking diabetic medications as directed with no adverse effects. He is taking lisinopril as directed.  No lightheadedness or other adverse effects. Today I did discuss with him that more and more research and information is showing that medics should be on statin regardless of their cholesterol readings.  His last lipid panel was 04/2016 with LDL 91 at that time.  He is fasting today.  We will recheck FLP now.  Then will decide on exact dose for a low-dose statin.  Gust this with him today and he is agreeable with this approach. Today I discussed with patient that I saw that he did  go for colonoscopy 02/01/2017--- but I did not read the report.  He states that it was normal and was told he can repeat 10 years.   06/17/2017: At last office visit we performed labs.  A1C was good at 6.7.  Therefore continued diabetic medications the same with no change.   LDL came back 109.  Therefore recommended to add simvastatin 10 mg daily.  Was to come fasting to follow-up visit.   Today he reports that he did NOT start the medicine.  Wanted to improve diet and exercise and recheck labs first.  Asked if he thinks that he did improve and that his diet and exercise have been better  compared to 3 months ago.  Says that he does think it has been a little more consistent. Today I explained the data regarding statins for diabetics regardless of LDL numbers.  Discussed that even if his LDL is improved on today's labs that I feel that he would benefit long-term from a low-dose statin.  Discussed that statins seem to have added benefit beyond lowering LDL and the numbers we can read on paper.  Have additional anti-inflammatory and plaque stabilizing benefits and discussed studies that have been performed.  Discussed to continue his diet and exercise but recommend that he do go ahead and start the simvastatin 10 mg.  He is agreeable to do so. He continues to take lisinopril 10 mg daily.  Having no lightheadedness or other adverse effects. Continues to take the metformin 1000 mg twice daily and the Januvia 100 mg daily.  These are causing no adverse effects. Continues to take aspirin 81 mg daily.  Having no bleeding.  Asked about if he either of his kids are home for the summer.  Says that his daughter is working at a Animal nutritionist clinic here and then will return to [place] in August.  Discussed that I am sure his wife is very happy that her daughter is home.  Review of Systems: Consitutional: No fever, chills, fatigue, night sweats, lymphadenopathy, or weight changes. Eyes: No visual changes, eye redness, or discharge. ENT/Mouth: Ears: No otalgia, tinnitus, hearing loss, discharge. Nose: No congestion, rhinorrhea, sinus pain, or epistaxis. Throat: No sore throat, post nasal drip, or teeth pain. Cardiovascular: No CP, palpitations, diaphoresis, DOE, edema, orthopnea, PND. Respiratory: No cough, hemoptysis, SOB, or wheezing. Gastrointestinal: No anorexia, dysphagia, reflux, pain, nausea, vomiting, hematemesis, diarrhea, constipation, BRBPR, or melena. Genitourinary: No dysuria, frequency, urgency, hematuria, incontinence, nocturia, decreased urinary stream, discharge, impotence, or  testicular pain/masses. Musculoskeletal: No decreased ROM, myalgias, stiffness, joint swelling, or weakness. Skin: No rash, erythema, lesion changes, pain, warmth, jaundice, or pruritis. Neurological: No headache, dizziness, syncope, seizures, tremors, memory loss, coordination problems, or paresthesias. Psychological: No anxiety, depression, hallucinations, SI/HI. Endocrine: No increased fatigue. Positive--known diabetes. All other systems were reviewed and are otherwise negative.  Past Medical History:  Diagnosis Date  . Diabetes mellitus without complication ([place])   . Hypertension      Past Surgical History:  Procedure Laterality Date  . INCISION AND DRAINAGE PERIRECTAL ABSCESS N/A 02/24/2016   Procedure: IRRIGATION AND DEBRIDEMENT PERIRECTAL ABSCESS AND ANAL FISTULA;  Surgeon: Jackolyn Confer, MD;  Location: [facility];  Service: General;  Laterality: N/A;  . thumb surgery     in high school , hx injury playing football    Home Meds:  Outpatient Medications Prior to Visit  Medication Sig Dispense Refill  . aspirin 81 MG EC tablet TAKE 1 TABLET (81 MG TOTAL) BY MOUTH DAILY. [place]  tablet 2  . blood glucose meter kit and supplies Dispense based on patient and insurance preference. Use up to four times daily as directed. (FOR ICD-9 250.00, 250.01). 1 each 0  . glucose blood (ONE TOUCH ULTRA TEST) test strip Use as instructed 100 each 12  . JANUVIA 100 MG tablet TAKE 1 TABLET BY MOUTH EVERY MORNING 90 tablet 1  . Lancets (FREESTYLE) lancets Use as instructed 100 each 12  . lisinopril (PRINIVIL,ZESTRIL) 10 MG tablet TAKE 1 TABLET BY MOUTH EVERY DAY 90 tablet 0  . metFORMIN (GLUCOPHAGE) 1000 MG tablet TAKE 1 TABLET (1,000 MG TOTAL) BY MOUTH 2 (TWO) TIMES DAILY WITH A MEAL. 180 tablet 0  . simvastatin (ZOCOR) 10 MG tablet TAKE 1 TABLET BY MOUTH EVERYDAY AT BEDTIME 30 tablet 2   Facility-Administered Medications Prior to Visit  Medication Dose Route Frequency Provider Last Rate Last Dose  .  0.9 %  sodium chloride infusion  500 mL Intravenous Continuous Milus Banister, MD        Allergies: No Known Allergies  Social History   Socioeconomic History  . Marital status: Married    Spouse name: Not on file  . Number of children: Not on file  . Years of education: Not on file  . Highest education level: Not on file  Occupational History  . Not on file  Social Needs  . Financial resource strain: Not on file  . Food insecurity:    Worry: Not on file    Inability: Not on file  . Transportation needs:    Medical: Not on file    Non-medical: Not on file  Tobacco Use  . Smoking status: Former Smoker    Types: Cigars    Last attempt to quit: 01/19/2016    Years since quitting: 1.4  . Smokeless tobacco: Never Used  Substance and Sexual Activity  . Alcohol use: Yes    Comment: Social  . Drug use: No  . Sexual activity: Not on file  Lifestyle  . Physical activity:    Days per week: Not on file    Minutes per session: Not on file  . Stress: Not on file  Relationships  . Social connections:    Talks on phone: Not on file    Gets together: Not on file    Attends religious service: Not on file    Active member of club or organization: Not on file    Attends meetings of clubs or organizations: Not on file    Relationship status: Not on file  . Intimate partner violence:    Fear of current or ex partner: Not on file    Emotionally abused: Not on file    Physically abused: Not on file    Forced sexual activity: Not on file  Other Topics Concern  . Not on file  Social History Narrative  . Not on file    Family History  Problem Relation Age of Onset  . Diabetes Mother   . Breast cancer Mother   . Hypertension Mother   . Diabetes Father   . Lung cancer Father        smoker  . Liver cancer Father   . Colon cancer Neg Hx   . Colon polyps Neg Hx   . Esophageal cancer Neg Hx   . Rectal cancer Neg Hx   . Stomach cancer Neg Hx     Physical Exam: There were no  vitals taken for this visit., There is no height  or weight on file to calculate BMI. General: AAM. Appears in no acute distress. Neck: Supple. No thyromegaly. No lymphadenopathy.  No carotid bruit. Lungs: Clear bilaterally to auscultation without wheezes, rales, or rhonchi. Breathing is unlabored. Heart: RRR with S1 S2. No murmurs, rubs, or gallops. Abdomen: Soft, non-tender, non-distended with normoactive bowel sounds. No hepatomegaly. No rebound/guarding. No obvious abdominal masses. Musculoskeletal:  Strength and tone normal for age. Extremities/Skin: Warm and dry.  No LE edema.  Diabetic foot exam: Inspection is normal.  Sensation is intact.  2+ DP, PT pulses bilaterally. Neuro: Alert and oriented X 3. Moves all extremities spontaneously. Gait is normal. CNII-XII grossly in tact. Psych:  Responds to questions appropriately with a normal affect.   Assessment/Plan:  62 y.o. y/o AA male here for   Type 2 diabetes mellitus without complication, without long-term current use of insulin (HCC) 04/23/2016---Added ACE Inh (lisinopril 57m QD) and ASA 83m 05/28/2016--Check A1C 05/28/2016----Discussed proper diabetic foot care.  -----------------Discussed Diabetic Eye Exam---He says he already has annual eye exam--told him to inform them of his dx of Diabetes 05/28/2016---LDL was 91 on FLP 04/23/2016----no statin added----will monitor---in future, if LDL increases at all, or does not decrease--then will add low dose statin.  08/27/2016----recheck A1c. Check microalbumin. He reports that [facility] [address] [facility] and he will continue to have this annually and will have them send copy of their next OV [place] [facility] now.  12/03/2016----recheck A1c to monitor. 03/11/2017--- today we will check A1c and also will check FLP and LFT.   Today I did discuss with him that more and more research and information is showing that medics should be on statin  regardless of their cholesterol readings.  His last lipid panel was 04/2016 with LDL 91 at that time.  He is fasting today.  We will recheck FLP now.  Then will decide on exact dose for a low-dose statin.  Discussed this with him today and he is agreeable with this approach. 06/17/2017:  Labs at last visit did show LDL 109.  At that time recommended that he start simvastatin 10 mg daily.   LDL came back 109.  Therefore recommended to add simvastatin 10 mg daily.  Was to come fasting to follow-up visit.   Today he reports that he did NOT start the medicine.  Wanted to improve diet and exercise and recheck labs first.  Asked if he thinks that he did improve and that his diet and exercise have been better compared to 3 months ago.  Says that he does think it has been a little more consistent. Today I explained the data regarding statins for diabetics regardless of LDL numbers.  Discussed that even if his LDL is improved on today's labs that I feel that he would benefit long-term from a low-dose statin.  Discussed that statins seem to have added benefit beyond lowering LDL and the numbers we can read on paper.  Have additional anti-inflammatory and plaque stabilizing benefits and discussed studies that have been performed.  Discussed to continue his diet and exercise but recommend that he do go ahead and start the simvastatin 10 mg.  He is agreeable to do so.  Also he is on ACE inhibitor lisinopril 10 mg daily.  He is on aspirin 81 mg daily.  Will recheck A1c today.  Last A1c was good.  On metformin 1000 twice daily and Januvia 100 mg daily.  Essential hypertension 04/23/2016--Added -  lisinopril 23m QD 05/28/2016--Cont current dose. Check BMET to monitor. 08/27/2016---BP at goal. On ACE Inh. Lab stable at check 05/2016 12/03/2016--- BP at goal.  On ACE inhibitor.  Recheck lab to monitor. 03/11/2017: -- BP at goal.  On ACE inhibitor.  Recheck lab to monitor. 06/17/2017: - BP at goal.  On ACE inhibitor.  Recheck lab  to monitor.    3. Hyperlipidemia, unspecified hyperlipidemia type 03/11/2017--- today we will check A1c and also will check FLP and LFT.   Today I did discuss with him that more and more research and information is showing that medics should be on statin regardless of their cholesterol readings.  His last lipid panel was 04/2016 with LDL 91 at that time.  He is fasting today.  We will recheck FLP now.  Then will decide on exact dose for a low-dose statin.  Discussed this with him today and he is agreeable with this approach. 06/17/2017:  Labs at last visit did show LDL 109.   At that time recommended that he start simvastatin 10 mg daily.   LDL came back 109.  Therefore recommended to add simvastatin 10 mg daily.  Was to come fasting to follow-up visit.   Today he reports that he did NOT start the medicine.  Wanted to improve diet and exercise and recheck labs first.  Asked if he thinks that he did improve and that his diet and exercise have been better compared to 3 months ago.  Says that he does think it has been a little more consistent. Today I explained the data regarding statins for diabetics regardless of LDL numbers.  Discussed that even if his LDL is improved on today's labs that I feel that he would benefit long-term from a low-dose statin.  Discussed that statins seem to have added benefit beyond lowering LDL and the numbers we can read on paper.  Have additional anti-inflammatory and plaque stabilizing benefits and discussed studies that have been performed.  Discussed to continue his diet and exercise but recommend that he do go ahead and start the simvastatin 10 mg.  He is agreeable to do so. - Lipid panel - Hepatic function panel  4. Prostate cancer screening 06/17/2017: Last PSA was checked 04/23/2016.  Recheck this today. - PSA   Routine OV 3 months, sooner if needed  ---------THE FOLLOWING IS COPIED FROM CPE NOTE 04/23/2016----------------  Encounter for preventive care  A.  Screening Labs: - CBC with Differential/Platelet - COMPLETE METABOLIC PANEL WITH GFR - Lipid panel - TSH - VITAMIN D 25 Hydroxy (Vit-D Deficiency, Fractures) - PSA  B. Screening For Prostate Cancer: - PSA  C. Screening For Colorectal Cancer:  - Ambulatory referral to Gastroenterology ---Addendum added 12/03/2016--- He did not follow up for his colonoscopy that I had ordered in April.  I discussed this at today's visit and he is agreeable so I have put in another order for GI for colonoscopy.  D. Immunizations: Flu---------------------------------N/A Tetanus-----------------------------Says he received this 2017 Pneumococcal----------------No indication to require this until age 4365Shingrix----------------------I discussed this and wrote it on his AVS---He is to call his insurance, find out about cost, and let me know---- ---------------------------------- he did check this --- his insurance does cover shingrix--- he is agreeable to get this. First dose given at visit 05/28/16. Will give second/last dose in 2-6 months. Will give that dose at his next visit 3 months. Shingrix # 1--Given 05/28/2016 Shingrix # 2--- AT OV 08/27/2016---THIS IS ON BACK-ORDER--WILL GIVE AT NEXT OV IF IT IS  AVAILABLE------------ Shingrix # 2------- At [place] 12/03/2016----- Reviewed that he received # 2 on 09/07/2016-----      Signed:   [address] [place], [place]  06/17/2017 7:57 AM

## 2017-06-18 LAB — LIPID PANEL
Cholesterol: 168 mg/dL (ref <200)
HDL: 42 mg/dL (ref >40)
LDL CHOLESTEROL (CALC): 103 mg/dL — AB
NON-HDL CHOLESTEROL (CALC): 126 mg/dL (ref <130)
Total CHOL/HDL Ratio: 4 (calc) (ref <5.0)
Triglycerides: 126 mg/dL (ref <150)

## 2017-06-18 LAB — HEPATIC FUNCTION PANEL
AG RATIO: 1.7 (calc) (ref 1.0–2.5)
ALT: 18 U/L (ref 9–46)
AST: 14 U/L (ref 10–35)
Albumin: 4.6 g/dL (ref 3.6–5.1)
Alkaline phosphatase (APISO): 42 U/L (ref 40–115)
Bilirubin, Direct: 0.1 mg/dL (ref 0.0–0.2)
Globulin: 2.7 g/dL (calc) (ref 1.9–3.7)
Indirect Bilirubin: 0.2 mg/dL (calc) (ref 0.2–1.2)
TOTAL PROTEIN: 7.3 g/dL (ref 6.1–8.1)
Total Bilirubin: 0.3 mg/dL (ref 0.2–1.2)

## 2017-06-18 LAB — PSA: PSA: 0.8 ng/mL (ref <=4.0)

## 2017-06-18 LAB — HEMOGLOBIN A1C
EAG (MMOL/L): 7.6 (calc)
Hgb A1c MFr Bld: 6.4 % of total Hgb — ABNORMAL HIGH (ref <5.7)
MEAN PLASMA GLUCOSE: 137 (calc)

## 2017-07-21 ENCOUNTER — Other Ambulatory Visit: Payer: Self-pay | Admitting: Physician Assistant

## 2017-07-21 DIAGNOSIS — E119 Type 2 diabetes mellitus without complications: Secondary | ICD-10-CM

## 2017-08-02 ENCOUNTER — Other Ambulatory Visit: Payer: Self-pay | Admitting: Physician Assistant

## 2017-08-15 ENCOUNTER — Other Ambulatory Visit: Payer: Self-pay | Admitting: Physician Assistant

## 2017-08-15 DIAGNOSIS — I10 Essential (primary) hypertension: Secondary | ICD-10-CM

## 2017-08-15 DIAGNOSIS — E119 Type 2 diabetes mellitus without complications: Secondary | ICD-10-CM

## 2017-08-16 ENCOUNTER — Encounter: Payer: Self-pay | Admitting: Physician Assistant

## 2017-09-05 ENCOUNTER — Ambulatory Visit (INDEPENDENT_AMBULATORY_CARE_PROVIDER_SITE_OTHER): Payer: 59 | Admitting: Physician Assistant

## 2017-09-05 ENCOUNTER — Encounter: Payer: Self-pay | Admitting: Physician Assistant

## 2017-09-05 ENCOUNTER — Other Ambulatory Visit: Payer: Self-pay

## 2017-09-05 VITALS — BP 104/62 | HR 77 | Temp 98.4°F | Resp 16 | Ht 69.5 in | Wt 208.0 lb

## 2017-09-05 DIAGNOSIS — Z125 Encounter for screening for malignant neoplasm of prostate: Secondary | ICD-10-CM | POA: Diagnosis not present

## 2017-09-05 DIAGNOSIS — I10 Essential (primary) hypertension: Secondary | ICD-10-CM

## 2017-09-05 DIAGNOSIS — E119 Type 2 diabetes mellitus without complications: Secondary | ICD-10-CM

## 2017-09-05 DIAGNOSIS — E785 Hyperlipidemia, unspecified: Secondary | ICD-10-CM | POA: Diagnosis not present

## 2017-09-05 NOTE — Progress Notes (Signed)
Patient ID: Paul Nichols MRN: [id number], DOB: [birth year] 62 y.o. Date of Encounter: 09/05/2017, 8:12 AM    Chief Complaint: Routine OV, f/u DM, HTN  HPI: 62 y.o. y/o male here for routine OV.Marland Kitchen   He had OV with me as New Pt to Establish Care---that OV was on 03/26/2016. He was hospitalized 02/23/16-02/26/16. At that time he was developing a perirectal abscess. At that time he was also newly diagnosed with diabetes. Prior to that, he had not seen any type of medical provider in many years. He reported that both of his parents have diabetes.  Had OV with me 03/26/2016. At that [place], was agreeable to return fasting for CPE.   Returned 04/23/2016 for CPE. Fasting. No concerns to address.  At OV 04/23/2016--added Lisinopril 29m QD.   05/28/2016: Today he states that he did add the lisinopril and is taking this daily. He is having no adverse effects. Also is taking the aspirin 81 mg daily. Continues to take the metformin as directed. No adverse effects with this. He states he did check with his insurance and they do cover shingle wrecks so he is agreeable to get this. No concerns/complaints to discuss today.   08/27/2016: Reviewed lab results from last OV 05/28/2016. At that time A1c was 7.4 and I recommended add Januvia 100 mg daily. CMET was stable. Today he reports that he did add the Januvia and is taking this daily with no adverse effects. He states that he does continue to check blood sugar every morning and since adding the Januvia usually gets fasting sugars mid 80s to 90s "if I am eating right and doing what I'm supposed to / behaving myself ". He continues to take the lisinopril daily. Having no adverse effects. No lightheadedness. He has no specific concerns to address today.   12/03/2016: His wife, [name] is also a patient of mine.  She always talks about her kids a lot.  Their son currently has a job working in [place]  Their daughter is currently in vAnimal nutritionist school.  Today patient states that both of them did get to come home for Thanksgiving.  Says that the son actually got snowed in and has had an extra day here, which made his mother very happy !!! Reviewed that at his last visit his labs came back excellent with A1c 6.5. Today patient has no specific concerns to address today. He is taking diabetic medications as directed with no adverse effects. He is taking lisinopril as directed.  No lightheadedness or other adverse effects. Today I did review with him that he did not follow-up with his colonoscopy I had referred him for.  Today he is agreeable to go for this and does want me to go ahead and put in another order for this.   03/11/2017: He has no specific concerns to address today. He is taking diabetic medications as directed with no adverse effects. He is taking lisinopril as directed.  No lightheadedness or other adverse effects. Today I did discuss with him that more and more research and information is showing that medics should be on statin regardless of their cholesterol readings.  His last lipid panel was 04/2016 with LDL 91 at that time.  He is fasting today.  We will recheck FLP now.  Then will decide on exact dose for a low-dose statin.  Gust this with him today and he is agreeable with this approach. Today I discussed with patient that I saw that he did  go for colonoscopy 02/01/2017--- but I did not read the report.  He states that it was normal and was told he can repeat 10 years.   06/17/2017: At last office visit we performed labs.  A1C was good at 6.7.  Therefore continued diabetic medications the same with no change.   LDL came back 109.  Therefore recommended to add simvastatin 10 mg daily.  Was to come fasting to follow-up visit.   Today he reports that he did NOT start the medicine.  Wanted to improve diet and exercise and recheck labs first.  Asked if he thinks that he did improve and that his diet and exercise have been better  compared to 3 months ago.  Says that he does think it has been a little more consistent. Today I explained the data regarding statins for diabetics regardless of LDL numbers.  Discussed that even if his LDL is improved on today's labs that I feel that he would benefit long-term from a low-dose statin.  Discussed that statins seem to have added benefit beyond lowering LDL and the numbers we can read on paper.  Have additional anti-inflammatory and plaque stabilizing benefits and discussed studies that have been performed.  Discussed to continue his diet and exercise but recommend that he do go ahead and start the simvastatin 10 mg.  He is agreeable to do so. He continues to take lisinopril 10 mg daily.  Having no lightheadedness or other adverse effects. Continues to take the metformin 1000 mg twice daily and the Januvia 100 mg daily.  These are causing no adverse effects. Continues to take aspirin 81 mg daily.  Having no bleeding.  Asked about if he either of his kids are home for the summer.  Says that his daughter is working at a Animal nutritionist clinic here and then will return to [place] in August.  Discussed that I am sure his wife is very happy that her daughter is home.    09/05/2017: At last visit-- performed labs.  A1C was good at 6.4.  LDL came back 103.  At that time, recommended simvastatin 10 mg.  Other labs were normal. Today he reports he did add the simvastatin.  Is taking this daily.  Causing no adverse effects. He continues to take lisinopril 10 mg daily.  Having no lightheadedness or other adverse effects. Continues to take the metformin 1000 mg twice daily and the Januvia 100 mg daily.  These are causing no adverse effects. Continues to take aspirin 81 mg daily.  Having no bleeding.     Review of Systems: Consitutional: No fever, chills, fatigue, night sweats, lymphadenopathy, or weight changes. Eyes: No visual changes, eye redness, or discharge. ENT/Mouth: Ears: No otalgia,  tinnitus, hearing loss, discharge. Nose: No congestion, rhinorrhea, sinus pain, or epistaxis. Throat: No sore throat, post nasal drip, or teeth pain. Cardiovascular: No CP, palpitations, diaphoresis, DOE, edema, orthopnea, PND. Respiratory: No cough, hemoptysis, SOB, or wheezing. Gastrointestinal: No anorexia, dysphagia, reflux, pain, nausea, vomiting, hematemesis, diarrhea, constipation, BRBPR, or melena. Genitourinary: No dysuria, frequency, urgency, hematuria, incontinence, nocturia, decreased urinary stream, discharge, impotence, or testicular pain/masses. Musculoskeletal: No decreased ROM, myalgias, stiffness, joint swelling, or weakness. Skin: No rash, erythema, lesion changes, pain, warmth, jaundice, or pruritis. Neurological: No headache, dizziness, syncope, seizures, tremors, memory loss, coordination problems, or paresthesias. Psychological: No anxiety, depression, hallucinations, SI/HI. Endocrine: No increased fatigue. Positive--known diabetes. All other systems were reviewed and are otherwise negative.  Past Medical History:  Diagnosis Date  . Diabetes mellitus  without complication ([place])   . Hypertension      Past Surgical History:  Procedure Laterality Date  . INCISION AND DRAINAGE PERIRECTAL ABSCESS N/A 02/24/2016   Procedure: IRRIGATION AND DEBRIDEMENT PERIRECTAL ABSCESS AND ANAL FISTULA;  Surgeon: Jackolyn Confer, MD;  Location: [facility];  Service: General;  Laterality: N/A;  . thumb surgery     in high school , hx injury playing football    Home Meds:  Outpatient Medications Prior to Visit  Medication Sig Dispense Refill  . aspirin 81 MG EC tablet TAKE 1 TABLET BY MOUTH EVERY DAY 90 tablet 2  . blood glucose meter kit and supplies Dispense based on patient and insurance preference. Use up to four times daily as directed. (FOR ICD-9 250.00, 250.01). 1 each 0  . glucose blood (ONE TOUCH ULTRA TEST) test strip Use as instructed 100 each 12  . JANUVIA 100 MG tablet TAKE 1  TABLET BY MOUTH EVERY DAY IN THE MORNING 90 tablet 1  . Lancets (FREESTYLE) lancets Use as instructed 100 each 12  . lisinopril (PRINIVIL,ZESTRIL) 10 MG tablet TAKE 1 TABLET BY MOUTH EVERY DAY 90 tablet 0  . metFORMIN (GLUCOPHAGE) 1000 MG tablet TAKE 1 TABLET (1,000 MG TOTAL) BY MOUTH 2 (TWO) TIMES DAILY WITH A MEAL. 180 tablet 0  . simvastatin (ZOCOR) 10 MG tablet TAKE 1 TABLET BY MOUTH EVERYDAY AT BEDTIME 30 tablet 2   Facility-Administered Medications Prior to Visit  Medication Dose Route Frequency Provider Last Rate Last Dose  . 0.9 %  sodium chloride infusion  500 mL Intravenous Continuous Milus Banister, MD        Allergies: No Known Allergies  Social History   Socioeconomic History  . Marital status: Married    Spouse name: Not on file  . Number of children: Not on file  . Years of education: Not on file  . Highest education level: Not on file  Occupational History  . Not on file  Social Needs  . Financial resource strain: Not on file  . Food insecurity:    Worry: Not on file    Inability: Not on file  . Transportation needs:    Medical: Not on file    Non-medical: Not on file  Tobacco Use  . Smoking status: Former Smoker    Types: Cigars    Last attempt to quit: 01/19/2016    Years since quitting: 1.6  . Smokeless tobacco: Never Used  Substance and Sexual Activity  . Alcohol use: Yes    Comment: Social  . Drug use: No  . Sexual activity: Not on file  Lifestyle  . Physical activity:    Days per week: Not on file    Minutes per session: Not on file  . Stress: Not on file  Relationships  . Social connections:    Talks on phone: Not on file    Gets together: Not on file    Attends religious service: Not on file    Active member of club or organization: Not on file    Attends meetings of clubs or organizations: Not on file    Relationship status: Not on file  . Intimate partner violence:    Fear of current or ex partner: Not on file    Emotionally abused:  Not on file    Physically abused: Not on file    Forced sexual activity: Not on file  Other Topics Concern  . Not on file  Social History Narrative  . Not on  file    Family History  Problem Relation Age of Onset  . Diabetes Mother   . Breast cancer Mother   . Hypertension Mother   . Diabetes Father   . Lung cancer Father        smoker  . Liver cancer Father   . Colon cancer Neg Hx   . Colon polyps Neg Hx   . Esophageal cancer Neg Hx   . Rectal cancer Neg Hx   . Stomach cancer Neg Hx      Physical Exam: Blood pressure 104/62, pulse 77, temperature 98.4 F (36.9 C), temperature source Oral, resp. rate 16, height 5' 9.5" (1.765 m), weight 94.3 kg, SpO2 100 %., Body mass index is 30.28 kg/m. General: WNWD AAM. Appears in no acute distress. Neck: Supple. No thyromegaly. No lymphadenopathy. No carotid bruits. Lungs: Clear bilaterally to auscultation without wheezes, rales, or rhonchi. Breathing is unlabored. Heart: RRR with S1 S2. No murmurs, rubs, or gallops. Abdomen: Soft, non-tender, non-distended with normoactive bowel sounds. No hepatomegaly. No rebound/guarding. No obvious abdominal masses. Musculoskeletal:  Strength and tone normal for age. Extremities/Skin: Warm and dry.  No LE edema.  Neuro: Alert and oriented X 3. Moves all extremities spontaneously. Gait is normal. CNII-XII grossly in tact. Psych:  Responds to questions appropriately with a normal affect.    Diabetic foot exam: Inspection is normal.  Sensation is intact.  2+ DP, PT pulses bilaterally.  Assessment/Plan:  62 y.o. y/o AA male here for   Type 2 diabetes mellitus without complication, without long-term current use of insulin (HCC) 04/23/2016---Added ACE Inh (lisinopril 52m QD) and ASA 853m 05/28/2016--Check A1C 05/28/2016----Discussed proper diabetic foot care.  -----------------Discussed Diabetic Eye Exam---He says he already has annual eye exam--told him to inform them of his dx of  Diabetes 05/28/2016---LDL was 91 on FLP 04/23/2016----no statin added----will monitor---in future, if LDL increases at all, or does not decrease--then will add low dose statin.  08/27/2016----recheck A1c. Check microalbumin. He reports that [facility] [address] [facility] and he will continue to have this annually and will have them send copy of their next OV note to [place] [facility] now.  12/03/2016----recheck A1c to monitor. 03/11/2017--- today we will check A1c and also will check FLP and LFT.   Today I did discuss with him that more and more research and information is showing that medics should be on statin regardless of their cholesterol readings.  His last lipid panel was 04/2016 with LDL 91 at that time.  He is fasting today.  We will recheck FLP now.  Then will decide on exact dose for a low-dose statin.  Discussed this with him today and he is agreeable with this approach. 06/17/2017:  Labs at last visit did show LDL 109.  At that time recommended that he start simvastatin 10 mg daily.   LDL came back 109.  Therefore recommended to add simvastatin 10 mg daily.  Was to come fasting to follow-up visit.   Today he reports that he did NOT start the medicine.  Wanted to improve diet and exercise and recheck labs first.  Asked if he thinks that he did improve and that his diet and exercise have been better compared to 3 months ago.  Says that he does think it has been a little more consistent. Today I explained the data regarding statins for diabetics regardless of LDL numbers.  Discussed that even if his LDL is improved on today's  labs that I feel that he would benefit long-term from a low-dose statin.  Discussed that statins seem to have added benefit beyond lowering LDL and the numbers we can read on paper.  Have additional anti-inflammatory and plaque stabilizing benefits and discussed studies that have been performed.  Discussed to continue his diet and exercise but  recommend that he do go ahead and start the simvastatin 10 mg.  He is agreeable to do so.  Also he is on ACE inhibitor lisinopril 10 mg daily.  He is on aspirin 81 mg daily.  Will recheck A1c today.  Last A1c was good.  On metformin 1000 twice daily and Januvia 100 mg daily.  09/05/2017: Is now on aspirin, ACE inhibitor, statin as well as meds to control sugar.  Last A1c at goal.  Essential hypertension 04/23/2016--Added - lisinopril 22m QD 05/28/2016--Cont current dose. Check BMET to monitor. 08/27/2016---BP at goal. On ACE Inh. Lab stable at check 05/2016 12/03/2016--- BP at goal.  On ACE inhibitor.  Recheck lab to monitor. 03/11/2017: -- BP at goal.  On ACE inhibitor.  Recheck lab to monitor. 06/17/2017: - BP at goal.  On ACE inhibitor.  Recheck lab to monitor. 09/05/2017: BP at goal.  On ACE inhibitor.   lab to monitor--normal at last check    3. Hyperlipidemia, unspecified hyperlipidemia type 03/11/2017--- today we will check A1c and also will check FLP and LFT.   Today I did discuss with him that more and more research and information is showing that medics should be on statin regardless of their cholesterol readings.  His last lipid panel was 04/2016 with LDL 91 at that time.  He is fasting today.  We will recheck FLP now.  Then will decide on exact dose for a low-dose statin.  Discussed this with him today and he is agreeable with this approach. 06/17/2017:  Labs at last visit did show LDL 109.   At that time recommended that he start simvastatin 10 mg daily.   LDL came back 109.  Therefore recommended to add simvastatin 10 mg daily.  Was to come fasting to follow-up visit.   Today he reports that he did NOT start the medicine.  Wanted to improve diet and exercise and recheck labs first.  Asked if he thinks that he did improve and that his diet and exercise have been better compared to 3 months ago.  Says that he does think it has been a little more consistent. Today I explained the data  regarding statins for diabetics regardless of LDL numbers.  Discussed that even if his LDL is improved on today's labs that I feel that he would benefit long-term from a low-dose statin.  Discussed that statins seem to have added benefit beyond lowering LDL and the numbers we can read on paper.  Have additional anti-inflammatory and plaque stabilizing benefits and discussed studies that have been performed.  Discussed to continue his diet and exercise but recommend that he do go ahead and start the simvastatin 10 mg.  He is agreeable to do so. - Lipid panel - Hepatic function panel 09/05/2017: He did start simvastatin 123m-- 06/17/2017.  He is fasting today.  Recheck FLP/LFT to monitor.   4. Prostate cancer screening 06/17/2017: Last PSA was checked 04/23/2016.  Recheck this today. - PSA 09/05/2017: PSA was normal at labs 06/17/2017.  Recheck 1 year.   Routine OV 3 months, sooner if needed  ---------THE FOLLOWING IS COPIED FROM CPE NOTE 04/23/2016----------------  Encounter for preventive  care  A. Screening Labs: - CBC with Differential/Platelet - COMPLETE METABOLIC PANEL WITH GFR - Lipid panel - TSH - VITAMIN D 25 Hydroxy (Vit-D Deficiency, Fractures) - PSA  B. Screening For Prostate Cancer: - PSA  C. Screening For Colorectal Cancer:  - Ambulatory referral to Gastroenterology ---Addendum added 12/03/2016--- He did not follow up for his colonoscopy that I had ordered in April.  I discussed this at today's visit and he is agreeable so I have put in another order for GI for colonoscopy.  D. Immunizations: Flu---------------------------------N/A Tetanus-----------------------------Says he received this 2017 Pneumococcal----------------No indication to require this until age 42 Shingrix----------------------I discussed this and wrote it on his AVS---He is to call his insurance, find out about cost, and let me know---- ---------------------------------- he did check this --- his insurance  does cover shingrix--- he is agreeable to get this. First dose given at visit 05/28/16. Will give second/last dose in 2-6 months. Will give that dose at his next visit 3 months. Shingrix # 1--Given 05/28/2016 Shingrix # 2--- AT OV 08/27/2016---THIS IS ON BACK-ORDER--WILL GIVE AT NEXT OV IF IT IS AVAILABLE------------ Shingrix # 2------- At OV 12/03/2016----- Reviewed that he received # 2 on 09/07/2016-----      Signed:   [address] [place], [place]  09/05/2017 8:12 AM

## 2017-09-06 LAB — HEPATIC FUNCTION PANEL
AG RATIO: 1.5 (calc) (ref 1.0–2.5)
ALT: 19 U/L (ref 9–46)
AST: 16 U/L (ref 10–35)
Albumin: 4.4 g/dL (ref 3.6–5.1)
Alkaline phosphatase (APISO): 41 U/L (ref 40–115)
BILIRUBIN INDIRECT: 0.2 mg/dL (ref 0.2–1.2)
BILIRUBIN TOTAL: 0.3 mg/dL (ref 0.2–1.2)
Bilirubin, Direct: 0.1 mg/dL (ref 0.0–0.2)
GLOBULIN: 2.9 g/dL (ref 1.9–3.7)
Total Protein: 7.3 g/dL (ref 6.1–8.1)

## 2017-09-06 LAB — LIPID PANEL
CHOLESTEROL: 134 mg/dL (ref <200)
HDL: 36 mg/dL — ABNORMAL LOW (ref >40)
LDL CHOLESTEROL (CALC): 80 mg/dL
Non-HDL Cholesterol (Calc): 98 mg/dL (calc) (ref <130)
TRIGLYCERIDES: 98 mg/dL (ref <150)
Total CHOL/HDL Ratio: 3.7 (calc) (ref <5.0)

## 2017-09-06 LAB — MICROALBUMIN, URINE: Microalb, Ur: 0.5 mg/dL

## 2017-09-14 ENCOUNTER — Other Ambulatory Visit: Payer: Self-pay | Admitting: Physician Assistant

## 2017-09-16 ENCOUNTER — Ambulatory Visit: Payer: 59 | Admitting: Physician Assistant

## 2017-11-15 ENCOUNTER — Other Ambulatory Visit: Payer: Self-pay | Admitting: Physician Assistant

## 2017-11-15 DIAGNOSIS — E119 Type 2 diabetes mellitus without complications: Secondary | ICD-10-CM

## 2017-11-15 DIAGNOSIS — I10 Essential (primary) hypertension: Secondary | ICD-10-CM

## 2017-12-09 ENCOUNTER — Ambulatory Visit: Payer: 59 | Admitting: Physician Assistant

## 2017-12-16 ENCOUNTER — Ambulatory Visit (INDEPENDENT_AMBULATORY_CARE_PROVIDER_SITE_OTHER): Payer: 59 | Admitting: Family Medicine

## 2017-12-16 ENCOUNTER — Encounter: Payer: Self-pay | Admitting: Family Medicine

## 2017-12-16 VITALS — BP 112/78 | HR 78 | Temp 98.0°F | Resp 14 | Ht 70.0 in | Wt 209.0 lb

## 2017-12-16 DIAGNOSIS — E119 Type 2 diabetes mellitus without complications: Secondary | ICD-10-CM

## 2017-12-16 DIAGNOSIS — I10 Essential (primary) hypertension: Secondary | ICD-10-CM | POA: Diagnosis not present

## 2017-12-16 DIAGNOSIS — E785 Hyperlipidemia, unspecified: Secondary | ICD-10-CM

## 2017-12-16 NOTE — Progress Notes (Signed)
Subjective:    Patient ID: Paul Nichols, male    DOB: 1955/09/10, 62 y.o.   MRN: [id number]  HPI  Patient is a 62 year old man who is here today to establish care with me.  Previously was a patient of my partner who has left the practice.  Past medical history is significant for diabetes mellitus type 2.  Most recent hemoglobin A1c checked earlier this summer was within therapeutic range at less than 7.  He also has a history of hypertension.  He is on a low-dose statin primarily due to his diabetes.  His prostate was checked and June of this year and his PSA was normal.  His colonoscopy is up-to-date.  Immunizations are up-to-date including his flu shot.  He is due for a diabetic foot exam as well as a diabetic eye exam according to his chart.  He denies any chest pain shortness of breath or dyspnea on exertion.  He denies any myalgias or right upper quadrant pain.  He denies any polyuria, polydipsia, or blurry vision.  He denies any peripheral neuropathy. Past Medical History:  Diagnosis Date  . Diabetes mellitus without complication ([place])   . Hypertension    Past Surgical History:  Procedure Laterality Date  . INCISION AND DRAINAGE PERIRECTAL ABSCESS N/A 02/24/2016   Procedure: IRRIGATION AND DEBRIDEMENT PERIRECTAL ABSCESS AND ANAL FISTULA;  Surgeon: Jackolyn Confer, MD;  Location: [facility];  Service: General;  Laterality: N/A;  . thumb surgery     in high school , hx injury playing football   Current Outpatient Medications on File Prior to Visit  Medication Sig Dispense Refill  . aspirin 81 MG EC tablet TAKE 1 TABLET BY MOUTH EVERY DAY 90 tablet 2  . blood glucose meter kit and supplies Dispense based on patient and insurance preference. Use up to four times daily as directed. (FOR ICD-9 250.00, 250.01). 1 each 0  . glucose blood (ONE TOUCH ULTRA TEST) test strip Use as instructed 100 each 12  . JANUVIA 100 MG tablet TAKE 1 TABLET BY MOUTH EVERY DAY IN THE MORNING 90 tablet 1  . Lancets  (FREESTYLE) lancets Use as instructed 100 each 12  . lisinopril (PRINIVIL,ZESTRIL) 10 MG tablet TAKE 1 TABLET BY MOUTH EVERY DAY 90 tablet 0  . metFORMIN (GLUCOPHAGE) 1000 MG tablet TAKE 1 TABLET (1,000 MG TOTAL) BY MOUTH 2 (TWO) TIMES DAILY WITH A MEAL. 180 tablet 0  . simvastatin (ZOCOR) 10 MG tablet TAKE 1 TABLET BY MOUTH EVERYDAY AT BEDTIME 90 tablet 0   Current Facility-Administered Medications on File Prior to Visit  Medication Dose Route Frequency Provider Last Rate Last Dose  . 0.9 %  sodium chloride infusion  500 mL Intravenous Continuous Milus Banister, MD       No Known Allergies Social History   Socioeconomic History  . Marital status: Married    Spouse name: Not on file  . Number of children: Not on file  . Years of education: Not on file  . Highest education level: Not on file  Occupational History  . Not on file  Social Needs  . Financial resource strain: Not on file  . Food insecurity:    Worry: Not on file    Inability: Not on file  . Transportation needs:    Medical: Not on file    Non-medical: Not on file  Tobacco Use  . Smoking status: Former Smoker    Types: Cigars    Last attempt to quit: 01/19/2016  Years since quitting: 1.9  . Smokeless tobacco: Never Used  Substance and Sexual Activity  . Alcohol use: Yes    Comment: Social  . Drug use: No  . Sexual activity: Not on file  Lifestyle  . Physical activity:    Days per week: Not on file    Minutes per session: Not on file  . Stress: Not on file  Relationships  . Social connections:    Talks on phone: Not on file    Gets together: Not on file    Attends religious service: Not on file    Active member of club or organization: Not on file    Attends meetings of clubs or organizations: Not on file    Relationship status: Not on file  . Intimate partner violence:    Fear of current or ex partner: Not on file    Emotionally abused: Not on file    Physically abused: Not on file    Forced sexual  activity: Not on file  Other Topics Concern  . Not on file  Social History Narrative  . Not on file     Review of Systems  All other systems reviewed and are negative.      Objective:   Physical Exam  Constitutional: He is oriented to person, place, and time. He appears well-developed and well-nourished. No distress.  Neck: No JVD present. No thyromegaly present.  Cardiovascular: Normal rate, regular rhythm, normal heart sounds and intact distal pulses. Exam reveals no gallop and no friction rub.  No murmur heard. Pulmonary/Chest: Effort normal and breath sounds normal. No stridor. No respiratory distress. He has no wheezes. He has no rales.  Abdominal: Soft. Bowel sounds are normal. He exhibits no distension and no mass. There is no tenderness. There is no rebound and no guarding.  Musculoskeletal: He exhibits no edema.  Neurological: He is alert and oriented to person, place, and time. No cranial nerve deficit.  Skin: He is not diaphoretic.  Vitals reviewed.         Assessment & Plan:  Type 2 diabetes mellitus without complication, without long-term current use of insulin (HCC)  Hyperlipidemia, unspecified hyperlipidemia type  Essential hypertension  Patient's blood pressure is outstanding.  He denies any cough on lisinopril so therefore I will make no changes at the present time.  His fasting blood sugars are under 130.  He seldom checks postprandial sugars.  I have encouraged him to check 2-hour postprandial sugars.  Ideally I like to see them less than 160.  He denies any hypoglycemia.  Therefore I would make no changes in his metformin.  If hemoglobin A1c is less than 6.5, I would space the patient screening interval to every 6 months.  I will monitor for diabetic nephropathy by checking a urine microalbumin.  He is compliant with aspirin.  We discussed switching to a moderate to high intensity statin based on his history of diabetes.  Patient weighed the benefit and harms  of doing this and has elected to stay on a low-dose statin simvastatin.  I will check his fasting lipid panel and if his LDL is greater than 100 I would the patient to Lipitor 20 mg or 40 mg rather than simvastatin 10.  His flu shot and diabetic foot exam are up-to-date.  I recommended a diabetic eye exam.

## 2017-12-17 LAB — CBC WITH DIFFERENTIAL/PLATELET
BASOS PCT: 1 %
Basophils Absolute: 41 cells/uL (ref 0–200)
Eosinophils Absolute: 49 cells/uL (ref 15–500)
Eosinophils Relative: 1.2 %
HCT: 44.7 % (ref 38.5–50.0)
Hemoglobin: 13.6 g/dL (ref 13.2–17.1)
LYMPHS ABS: 1738 {cells}/uL (ref 850–3900)
MCH: 22.6 pg — ABNORMAL LOW (ref 27.0–33.0)
MCHC: 30.4 g/dL — ABNORMAL LOW (ref 32.0–36.0)
MCV: 74.3 fL — ABNORMAL LOW (ref 80.0–100.0)
MPV: 11 fL (ref 7.5–12.5)
Monocytes Relative: 11.1 %
NEUTROS PCT: 44.3 %
Neutro Abs: 1816 cells/uL (ref 1500–7800)
PLATELETS: 245 10*3/uL (ref 140–400)
RBC: 6.02 10*6/uL — AB (ref 4.20–5.80)
RDW: 16.2 % — AB (ref 11.0–15.0)
TOTAL LYMPHOCYTE: 42.4 %
WBC: 4.1 10*3/uL (ref 3.8–10.8)
WBCMIX: 455 {cells}/uL (ref 200–950)

## 2017-12-17 LAB — COMPLETE METABOLIC PANEL WITH GFR
AG Ratio: 1.5 (calc) (ref 1.0–2.5)
ALBUMIN MSPROF: 4.6 g/dL (ref 3.6–5.1)
ALT: 17 U/L (ref 9–46)
AST: 17 U/L (ref 10–35)
Alkaline phosphatase (APISO): 40 U/L (ref 40–115)
BUN: 25 mg/dL (ref 7–25)
CALCIUM: 9.8 mg/dL (ref 8.6–10.3)
CO2: 27 mmol/L (ref 20–32)
CREATININE: 1.25 mg/dL (ref 0.70–1.25)
Chloride: 102 mmol/L (ref 98–110)
GFR, EST AFRICAN AMERICAN: 72 mL/min/{1.73_m2} (ref >=60)
GFR, Est Non African American: 62 mL/min/{1.73_m2} (ref >=60)
GLUCOSE: 125 mg/dL — AB (ref 65–99)
Globulin: 3 g/dL (calc) (ref 1.9–3.7)
Potassium: 4.6 mmol/L (ref 3.5–5.3)
Sodium: 138 mmol/L (ref 135–146)
TOTAL PROTEIN: 7.6 g/dL (ref 6.1–8.1)
Total Bilirubin: 0.4 mg/dL (ref 0.2–1.2)

## 2017-12-17 LAB — LIPID PANEL
CHOLESTEROL: 127 mg/dL (ref <200)
HDL: 38 mg/dL — AB (ref >40)
LDL Cholesterol (Calc): 71 mg/dL (calc)
Non-HDL Cholesterol (Calc): 89 mg/dL (calc) (ref <130)
Total CHOL/HDL Ratio: 3.3 (calc) (ref <5.0)
Triglycerides: 97 mg/dL (ref <150)

## 2017-12-17 LAB — MICROALBUMIN, URINE: Microalb, Ur: 0.5 mg/dL

## 2017-12-17 LAB — HEMOGLOBIN A1C
Hgb A1c MFr Bld: 6.8 % of total Hgb — ABNORMAL HIGH (ref <5.7)
Mean Plasma Glucose: 148 (calc)
eAG (mmol/L): 8.2 (calc)

## 2018-01-25 IMAGING — CT CT ABD-PELV W/ CM
2 of 5 series · 16 of 46 positions shown, 18 images · IV contrast (ISOVUE)
Comparison: None.

CLINICAL DATA: Fever and rectal pain

EXAM:
CT ABDOMEN AND PELVIS WITH CONTRAST
TECHNIQUE: Multidetector CT imaging of the abdomen and pelvis was performed
using the standard protocol following bolus administration of
intravenous contrast.
CONTRAST:  100mL UF9CMC-Q66 IOPAMIDOL (UF9CMC-Q66) INJECTION 61%

[Series 2: abd/pel with · axial · 0.82mm/px · z∈[+978,+1402]mm · 13 of 99 slices shown, 15 images]
[im 7/99  soft-tissue]
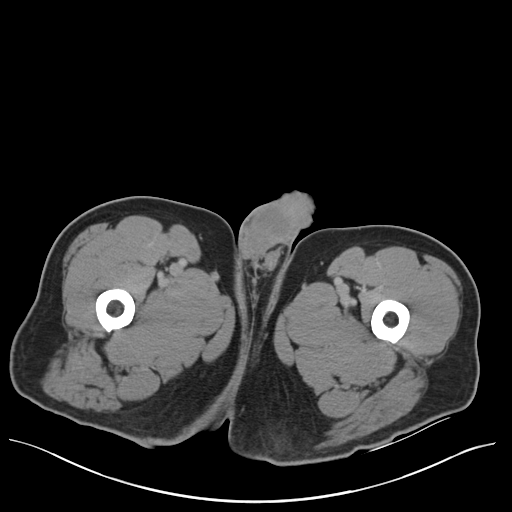
[im 7/99  bone]
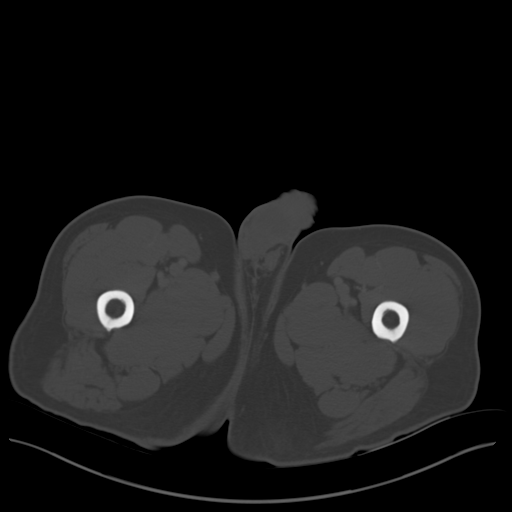
[im 13/99  soft-tissue]
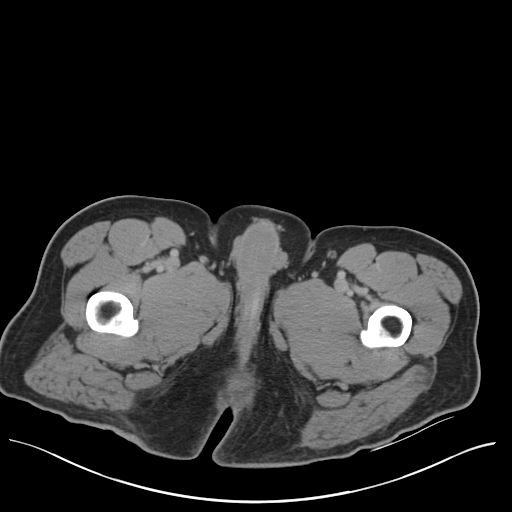
[im 19/99  soft-tissue]
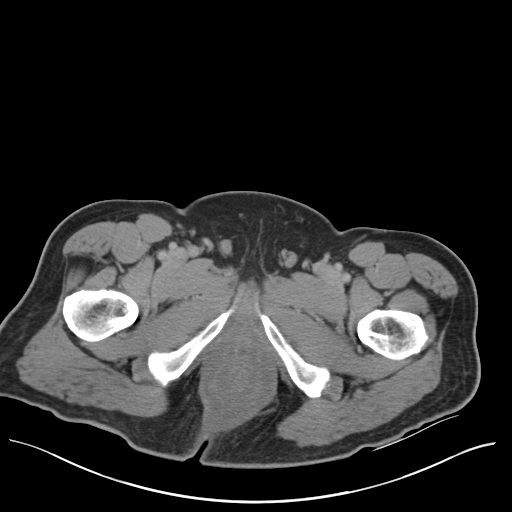
[im 31/99  soft-tissue]
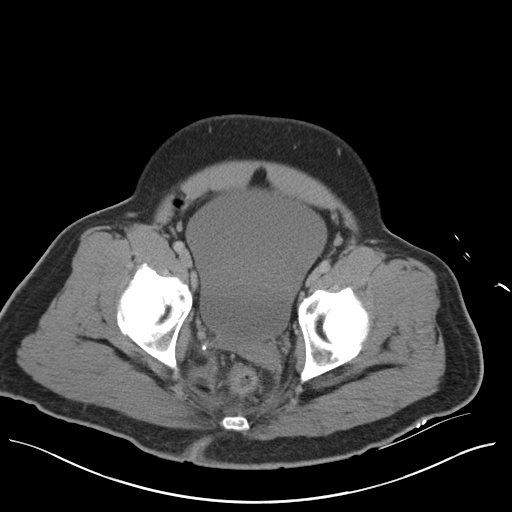
[im 37/99  soft-tissue]
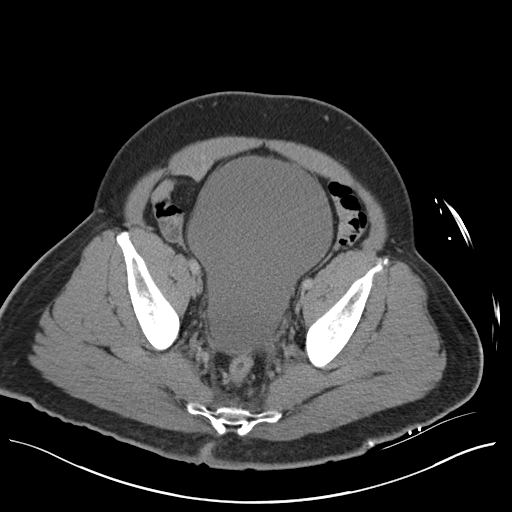
[im 43/99  soft-tissue]
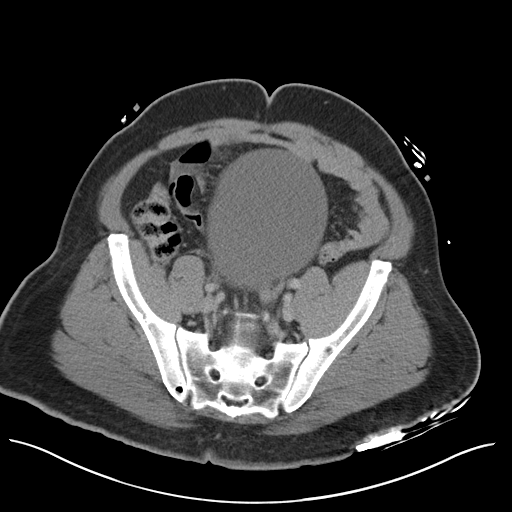
[im 50/99  soft-tissue]
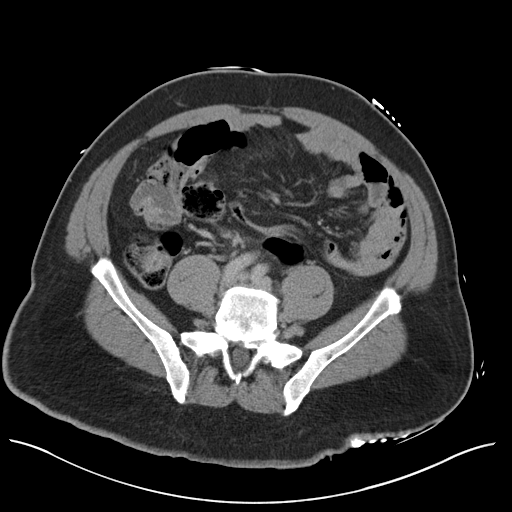
[im 56/99  soft-tissue]
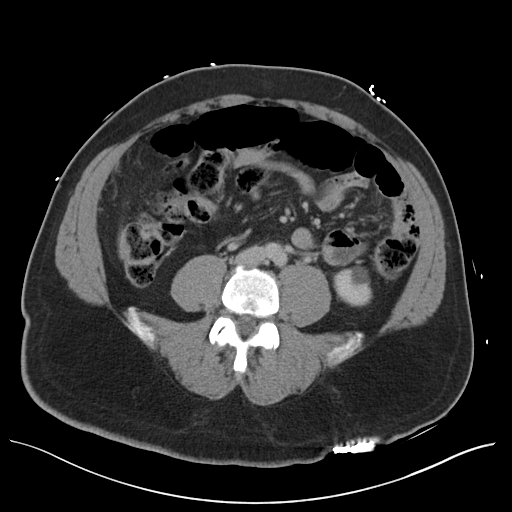
[im 62/99  soft-tissue]
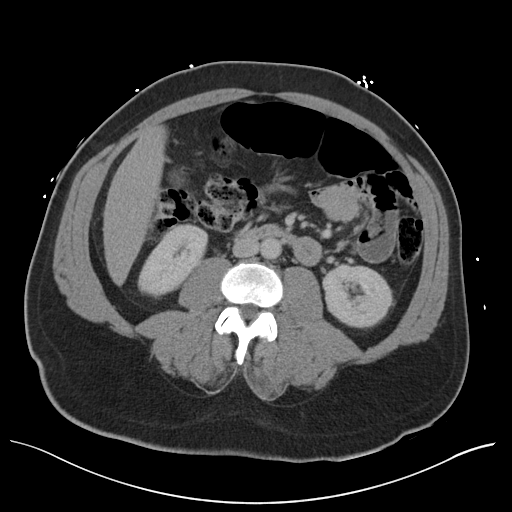
[im 62/99  bone]
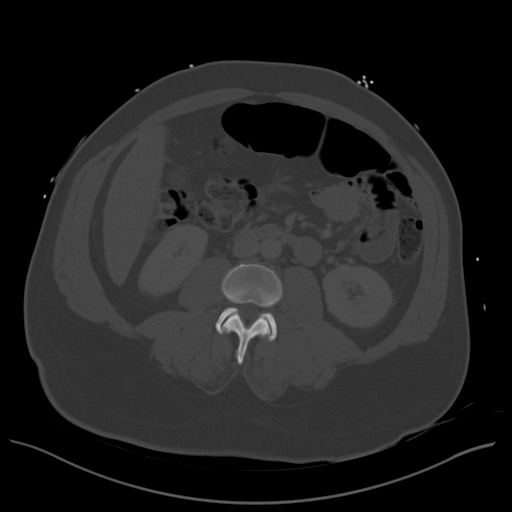
[im 68/99  soft-tissue]
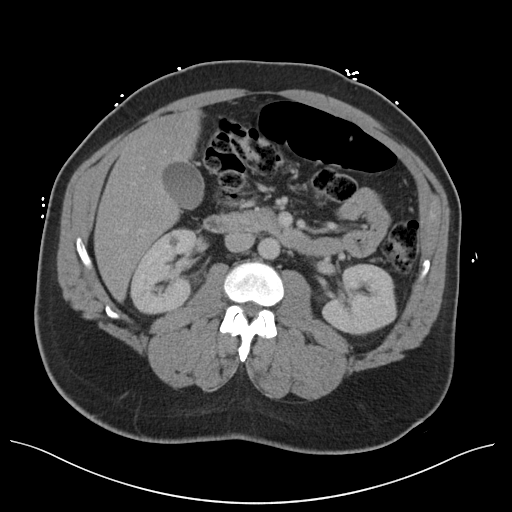
[im 80/99  soft-tissue]
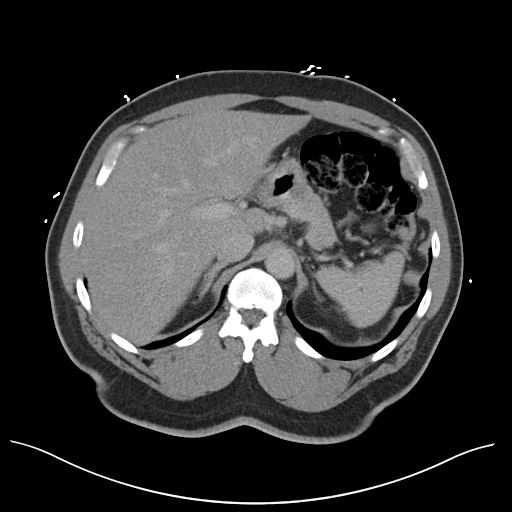
[im 86/99  soft-tissue]
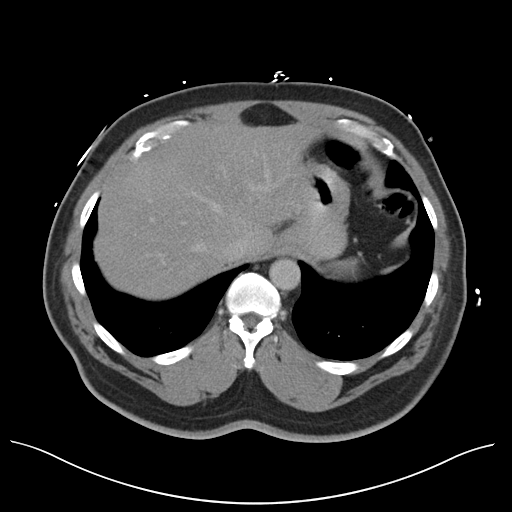
[im 92/99  soft-tissue]
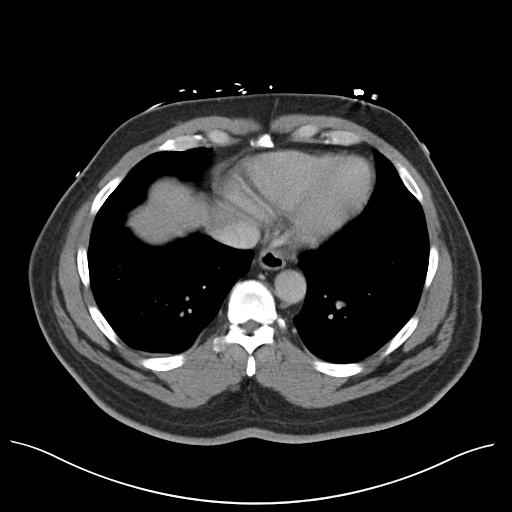

[Series 3: coronal a/|p · coronal · 0.72mm/px · 3 of 170 slices shown]
[im 57/170  soft-tissue]
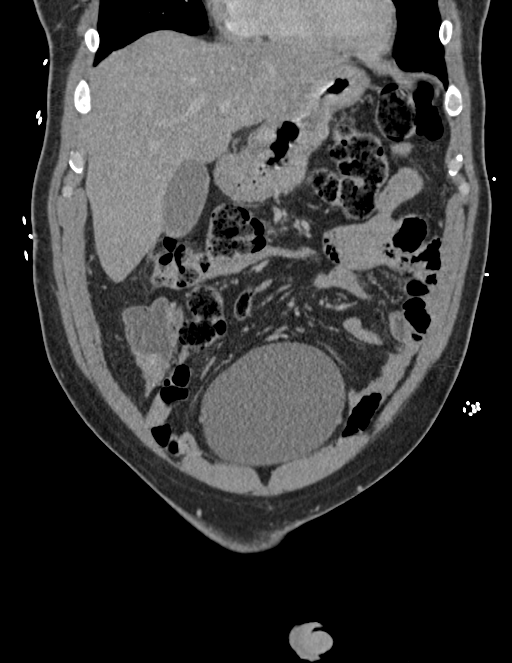
[im 76/170  soft-tissue]
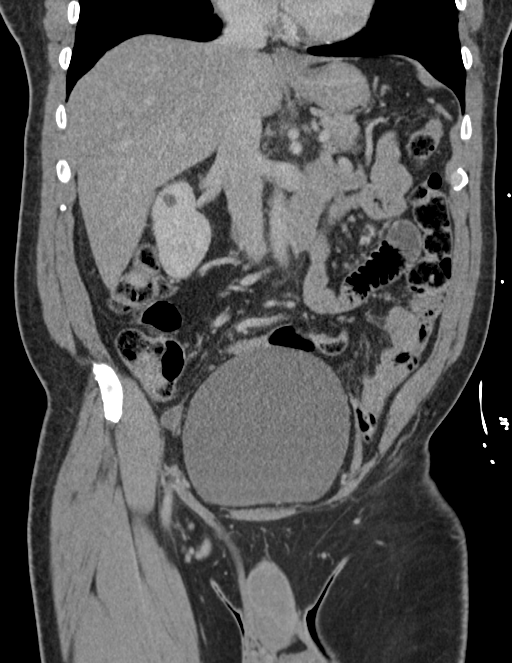
[im 94/170  soft-tissue]
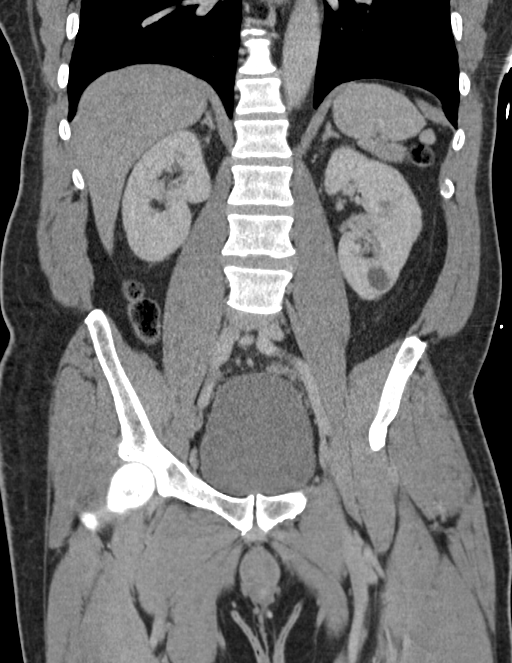

[16 of 46 positions shown; findings below may reference images not displayed]

FINDINGS: Lower chest: No pulmonary nodules. No visible pleural or pericardial
effusion.

Hepatobiliary: Normal hepatic size and contours without focal liver
lesion. No perihepatic ascites. No intra- or extrahepatic biliary
dilatation. Normal gallbladder.

Pancreas: Normal pancreatic contours and enhancement. No
peripancreatic fluid collection or pancreatic ductal dilatation.

Spleen: Normal.

Adrenals/Urinary Tract: Normal adrenal glands. No hydronephrosis or
solid renal mass. There is a 2 cm left renal cyst. There is a
subcentimeter hypoattenuating focus within the right kidney, too
small characterize accurately.

Stomach/Bowel: There is edema and soft tissue thickening surrounding
the anus. There is a focal low-attenuation area at the 6 o'clock
position that measures 1.9 x 1.6 cm. There is mild fat stranding
surrounding the distal rectum. No other colonic abnormality. No
small bowel dilatation. No abdominal fluid collection. The appendix
is not well visualized.

Vascular/Lymphatic: Normal course and caliber of the major abdominal
vessels. No abdominal or pelvic adenopathy.

Reproductive: Normal prostate and seminal vesicles.

Musculoskeletal: Multilevel lumbar facet arthrosis. No lytic or
blastic osseous lesion. Normal visualized extrathoracic and
extraperitoneal soft tissues.

Other: No contributory non-categorized findings.
IMPRESSION: 1. Soft tissue thickening and edema surrounding the distal rectum
and anus, which may indicate proctitis.
2. Small, low-attenuation collection near the 6 o'clock position may
indicate a small/developing perirectal abscess or, less likely, a
fistula Abong Genis.
3. Follow-up after clinical resolution is recommended, as a
superimposed neoplastic process would be difficult to exclude.

## 2018-02-03 ENCOUNTER — Other Ambulatory Visit: Payer: Self-pay | Admitting: *Deleted

## 2018-02-03 MED ORDER — SITAGLIPTIN PHOSPHATE 100 MG PO TABS: ORAL_TABLET | ORAL | 1 refills | Status: DC

## 2018-02-09 ENCOUNTER — Other Ambulatory Visit: Payer: Self-pay | Admitting: Family Medicine

## 2018-02-09 DIAGNOSIS — E119 Type 2 diabetes mellitus without complications: Secondary | ICD-10-CM

## 2018-02-24 ENCOUNTER — Other Ambulatory Visit: Payer: Self-pay | Admitting: Family Medicine

## 2018-02-24 DIAGNOSIS — I10 Essential (primary) hypertension: Secondary | ICD-10-CM

## 2018-02-24 DIAGNOSIS — E119 Type 2 diabetes mellitus without complications: Secondary | ICD-10-CM

## 2018-03-31 ENCOUNTER — Other Ambulatory Visit: Payer: Self-pay | Admitting: Family Medicine

## 2018-03-31 MED ORDER — SIMVASTATIN 10 MG PO TABS: ORAL_TABLET | ORAL | 1 refills | Status: DC

## 2018-04-28 ENCOUNTER — Other Ambulatory Visit: Payer: Self-pay | Admitting: Family Medicine

## 2018-04-28 DIAGNOSIS — E119 Type 2 diabetes mellitus without complications: Secondary | ICD-10-CM

## 2018-04-28 MED ORDER — ASPIRIN 81 MG PO TBEC: 81.0000 mg | DELAYED_RELEASE_TABLET | Freq: Every day | ORAL | 2 refills | Status: DC

## 2018-05-23 ENCOUNTER — Other Ambulatory Visit: Payer: Self-pay | Admitting: Family Medicine

## 2018-05-23 DIAGNOSIS — I10 Essential (primary) hypertension: Secondary | ICD-10-CM

## 2018-05-23 DIAGNOSIS — E119 Type 2 diabetes mellitus without complications: Secondary | ICD-10-CM

## 2018-05-23 MED ORDER — SIMVASTATIN 10 MG PO TABS: ORAL_TABLET | ORAL | 1 refills | Status: DC

## 2018-05-23 MED ORDER — METFORMIN HCL 1000 MG PO TABS: 1000.0000 mg | ORAL_TABLET | Freq: Two times a day (BID) | ORAL | 0 refills | Status: DC

## 2018-05-23 MED ORDER — SITAGLIPTIN PHOSPHATE 100 MG PO TABS: ORAL_TABLET | ORAL | 1 refills | Status: DC

## 2018-05-23 MED ORDER — LISINOPRIL 10 MG PO TABS: 10.0000 mg | ORAL_TABLET | Freq: Every day | ORAL | 3 refills | Status: DC

## 2018-09-07 ENCOUNTER — Other Ambulatory Visit: Payer: Self-pay | Admitting: Family Medicine

## 2018-09-07 DIAGNOSIS — E119 Type 2 diabetes mellitus without complications: Secondary | ICD-10-CM

## 2018-12-09 ENCOUNTER — Other Ambulatory Visit: Payer: Self-pay | Admitting: Family Medicine

## 2018-12-09 DIAGNOSIS — E119 Type 2 diabetes mellitus without complications: Secondary | ICD-10-CM

## 2018-12-12 ENCOUNTER — Telehealth: Payer: Self-pay | Admitting: Family Medicine

## 2018-12-28 ENCOUNTER — Other Ambulatory Visit: Payer: Self-pay | Admitting: Family Medicine

## 2018-12-29 ENCOUNTER — Other Ambulatory Visit: Payer: Self-pay | Admitting: *Deleted

## 2018-12-29 MED ORDER — ONETOUCH DELICA LANCETS 33G MISC: 11 refills | Status: DC

## 2018-12-29 MED ORDER — GLUCOSE BLOOD VI STRP: ORAL_STRIP | 12 refills | Status: DC

## 2019-01-26 ENCOUNTER — Other Ambulatory Visit: Payer: Self-pay | Admitting: Family Medicine

## 2019-01-26 DIAGNOSIS — E119 Type 2 diabetes mellitus without complications: Secondary | ICD-10-CM

## 2019-02-13 ENCOUNTER — Other Ambulatory Visit: Payer: Self-pay

## 2019-02-13 ENCOUNTER — Other Ambulatory Visit: Payer: BC Managed Care – PPO

## 2019-02-13 DIAGNOSIS — Z0000 Encounter for general adult medical examination without abnormal findings: Secondary | ICD-10-CM

## 2019-02-13 DIAGNOSIS — E119 Type 2 diabetes mellitus without complications: Secondary | ICD-10-CM

## 2019-02-14 LAB — CBC WITH DIFFERENTIAL/PLATELET
Absolute Monocytes: 471 cells/uL (ref 200–950)
Basophils Absolute: 31 cells/uL (ref 0–200)
Basophils Relative: 0.7 %
Eosinophils Absolute: 48 cells/uL (ref 15–500)
Eosinophils Relative: 1.1 %
HCT: 44.6 % (ref 38.5–50.0)
Hemoglobin: 14.2 g/dL (ref 13.2–17.1)
Lymphs Abs: 1976 cells/uL (ref 850–3900)
MCH: 23.9 pg — ABNORMAL LOW (ref 27.0–33.0)
MCHC: 31.8 g/dL — ABNORMAL LOW (ref 32.0–36.0)
MCV: 75 fL — ABNORMAL LOW (ref 80.0–100.0)
MPV: 11.3 fL (ref 7.5–12.5)
Monocytes Relative: 10.7 %
Neutro Abs: 1874 cells/uL (ref 1500–7800)
Neutrophils Relative %: 42.6 %
Platelets: 217 10*3/uL (ref 140–400)
RBC: 5.95 10*6/uL — ABNORMAL HIGH (ref 4.20–5.80)
RDW: 16.4 % — ABNORMAL HIGH (ref 11.0–15.0)
Total Lymphocyte: 44.9 %
WBC: 4.4 10*3/uL (ref 3.8–10.8)

## 2019-02-14 LAB — COMPREHENSIVE METABOLIC PANEL
AG Ratio: 1.6 (calc) (ref 1.0–2.5)
ALT: 18 U/L (ref 9–46)
AST: 15 U/L (ref 10–35)
Albumin: 4.8 g/dL (ref 3.6–5.1)
Alkaline phosphatase (APISO): 44 U/L (ref 35–144)
BUN: 25 mg/dL (ref 7–25)
CO2: 26 mmol/L (ref 20–32)
Calcium: 10.2 mg/dL (ref 8.6–10.3)
Chloride: 105 mmol/L (ref 98–110)
Creat: 1.25 mg/dL (ref 0.70–1.25)
Globulin: 3 g/dL (calc) (ref 1.9–3.7)
Glucose, Bld: 150 mg/dL — ABNORMAL HIGH (ref 65–99)
Potassium: 4.9 mmol/L (ref 3.5–5.3)
Sodium: 140 mmol/L (ref 135–146)
Total Bilirubin: 0.2 mg/dL (ref 0.2–1.2)
Total Protein: 7.8 g/dL (ref 6.1–8.1)

## 2019-02-14 LAB — LIPID PANEL
Cholesterol: 137 mg/dL (ref <200)
HDL: 38 mg/dL — ABNORMAL LOW (ref >=40)
LDL Cholesterol (Calc): 81 mg/dL (calc)
Non-HDL Cholesterol (Calc): 99 mg/dL (calc) (ref <130)
Total CHOL/HDL Ratio: 3.6 (calc) (ref <5.0)
Triglycerides: 99 mg/dL (ref <150)

## 2019-02-14 LAB — HEMOGLOBIN A1C
Hgb A1c MFr Bld: 7.8 % of total Hgb — ABNORMAL HIGH (ref <5.7)
Mean Plasma Glucose: 177 (calc)
eAG (mmol/L): 9.8 (calc)

## 2019-02-14 LAB — PSA: PSA: 0.8 ng/mL

## 2019-02-14 LAB — MICROALBUMIN / CREATININE URINE RATIO
Creatinine, Urine: 143 mg/dL (ref 20–320)
Microalb Creat Ratio: 6 ug/mg{creat}
Microalb, Ur: 0.9 mg/dL

## 2019-02-16 ENCOUNTER — Ambulatory Visit (INDEPENDENT_AMBULATORY_CARE_PROVIDER_SITE_OTHER): Payer: BC Managed Care – PPO | Admitting: Family Medicine

## 2019-02-16 ENCOUNTER — Other Ambulatory Visit: Payer: Self-pay

## 2019-02-16 ENCOUNTER — Encounter: Payer: Self-pay | Admitting: Family Medicine

## 2019-02-16 VITALS — BP 128/74 | HR 82 | Temp 97.4°F | Resp 14 | Ht 69.5 in | Wt 212.0 lb

## 2019-02-16 DIAGNOSIS — E119 Type 2 diabetes mellitus without complications: Secondary | ICD-10-CM | POA: Diagnosis not present

## 2019-02-16 DIAGNOSIS — E785 Hyperlipidemia, unspecified: Secondary | ICD-10-CM | POA: Diagnosis not present

## 2019-02-16 DIAGNOSIS — I10 Essential (primary) hypertension: Secondary | ICD-10-CM

## 2019-02-16 DIAGNOSIS — Z0001 Encounter for general adult medical examination with abnormal findings: Secondary | ICD-10-CM | POA: Diagnosis not present

## 2019-02-16 DIAGNOSIS — Z125 Encounter for screening for malignant neoplasm of prostate: Secondary | ICD-10-CM

## 2019-02-16 NOTE — Progress Notes (Signed)
Subjective:    Patient ID: Paul Nichols, male    DOB: 15-Mar-1955, 64 y.o.   MRN: [id number]  HPI  Patient is a very pleasant 64 year old African-American gentleman here today for complete physical exam.  Past medical history is significant for diabetes mellitus that previously has been well controlled.  However on his most recent lab work his labs were significant for hemoglobin A1c of 7.8.  His HDL cholesterol is also slightly low at 38.  Otherwise his labs look outstanding.  He is scheduling his own diabetic eye exam.  He schedules this once a year.  His diabetic foot exam was performed today and is outstanding showing no evidence of peripheral vascular disease or neuropathy.  His immunizations are up-to-date as dictated below.  His colonoscopy was performed in 2019 and is not due again until 2029.. Immunization History  Administered Date(s) Administered  . Influenza Inj Mdck Quad Pf 11/18/2017, 10/27/2018  . Influenza,inj,Quad PF,6+ Mos 12/03/2016  . Pneumococcal Polysaccharide-23 08/27/2016  . Tdap 07/09/2014  . Zoster Recombinat (Shingrix) 05/28/2016, 09/07/2016   Lab on 02/13/2019  Component Date Value Ref Range Status  . Cholesterol 02/13/2019 137  <200 mg/dL Final  . HDL 02/13/2019 38* > OR = 40 mg/dL Final  . Triglycerides 02/13/2019 99  <150 mg/dL Final  . LDL Cholesterol (Calc) 02/13/2019 81  mg/dL (calc) Final   Comment: Reference range: <100 . Desirable range <100 mg/dL for primary prevention;   <70 mg/dL for patients with CHD or diabetic patients  with > or = 2 CHD risk factors. Marland Kitchen LDL-C is now calculated using the Martin-Hopkins  calculation, which is a validated novel method providing  better accuracy than the Friedewald equation in the  estimation of LDL-C.  Cresenciano Genre et al. Annamaria Helling. 3716;967(89): 2061-2068  (http://education.[facility].com/faq/FAQ164)   . Total CHOL/HDL Ratio 02/13/2019 3.6  <5.0 (calc) Final  . Non-HDL Cholesterol (Calc) 02/13/2019 99  <130  mg/dL (calc) Final   Comment: For patients with diabetes plus 1 major ASCVD risk  factor, treating to a non-HDL-C goal of <100 mg/dL  (LDL-C of <70 mg/dL) is considered a therapeutic  option.   . WBC 02/13/2019 4.4  3.8 - 10.8 Thousand/uL Final  . RBC 02/13/2019 5.95* 4.20 - 5.80 Million/uL Final  . Hemoglobin 02/13/2019 14.2  13.2 - 17.1 g/dL Final  . HCT 02/13/2019 44.6  38.5 - 50.0 % Final  . MCV 02/13/2019 75.0* 80.0 - 100.0 fL Final  . MCH 02/13/2019 23.9* 27.0 - 33.0 pg Final  . MCHC 02/13/2019 31.8* 32.0 - 36.0 g/dL Final  . RDW 02/13/2019 16.4* 11.0 - 15.0 % Final  . Platelets 02/13/2019 217  140 - 400 Thousand/uL Final  . MPV 02/13/2019 11.3  7.5 - 12.5 fL Final  . Neutro Abs 02/13/2019 1,874  1,500 - 7,800 cells/uL Final  . Lymphs Abs 02/13/2019 1,976  850 - 3,900 cells/uL Final  . Absolute Monocytes 02/13/2019 471  200 - 950 cells/uL Final  . Eosinophils Absolute 02/13/2019 48  15 - 500 cells/uL Final  . Basophils Absolute 02/13/2019 31  0 - 200 cells/uL Final  . Neutrophils Relative % 02/13/2019 42.6  % Final  . Total Lymphocyte 02/13/2019 44.9  % Final  . Monocytes Relative 02/13/2019 10.7  % Final  . Eosinophils Relative 02/13/2019 1.1  % Final  . Basophils Relative 02/13/2019 0.7  % Final  . Glucose, Bld 02/13/2019 150* 65 - 99 mg/dL Final   Comment: .  Fasting reference interval . For someone without known diabetes, a glucose value >125 mg/dL indicates that they may have diabetes and this should be confirmed with a follow-up test. .   . BUN 02/13/2019 25  7 - 25 mg/dL Final  . Creat 02/13/2019 1.25  0.70 - 1.25 mg/dL Final   Comment: For patients >37 years of age, the reference limit for Creatinine is approximately 13% higher for people identified as African-American. .   Havery Moros Ratio 21/22/4825 NOT APPLICABLE  6 - 22 (calc) Final  . Sodium 02/13/2019 140  135 - 146 mmol/L Final  . Potassium 02/13/2019 4.9  3.5 - 5.3 mmol/L Final  .  Chloride 02/13/2019 105  98 - 110 mmol/L Final  . CO2 02/13/2019 26  20 - 32 mmol/L Final  . Calcium 02/13/2019 10.2  8.6 - 10.3 mg/dL Final  . Total Protein 02/13/2019 7.8  6.1 - 8.1 g/dL Final  . Albumin 02/13/2019 4.8  3.6 - 5.1 g/dL Final  . Globulin 02/13/2019 3.0  1.9 - 3.7 g/dL (calc) Final  . AG Ratio 02/13/2019 1.6  1.0 - 2.5 (calc) Final  . Total Bilirubin 02/13/2019 0.2  0.2 - 1.2 mg/dL Final  . Alkaline phosphatase (APISO) 02/13/2019 44  35 - 144 U/L Final  . AST 02/13/2019 15  10 - 35 U/L Final  . ALT 02/13/2019 18  9 - 46 U/L Final  . Hgb A1c MFr Bld 02/13/2019 7.8* <5.7 % of total Hgb Final   Comment: For someone without known diabetes, a hemoglobin A1c value of 6.5% or greater indicates that they may have  diabetes and this should be confirmed with a follow-up  test. . For someone with known diabetes, a value <7% indicates  that their diabetes is well controlled and a value  greater than or equal to 7% indicates suboptimal  control. A1c targets should be individualized based on  duration of diabetes, age, comorbid conditions, and  other considerations. . Currently, no consensus exists regarding use of hemoglobin A1c for diagnosis of diabetes for children. .   . Mean Plasma Glucose 02/13/2019 177  (calc) Final  . eAG (mmol/L) 02/13/2019 9.8  (calc) Final  . PSA 02/13/2019 0.8  < OR = 4.0 ng/mL Final   Comment: The total PSA value from this assay system is  standardized against the WHO standard. The test  result will be approximately 20% lower when compared  to the equimolar-standardized total PSA (Beckman  Coulter). Comparison of serial PSA results should be  interpreted with this fact in mind. . This test was performed using the Siemens  chemiluminescent method. Values obtained from  different assay methods cannot be used interchangeably. PSA levels, regardless of value, should not be interpreted as absolute evidence of the presence or absence of disease.     . Creatinine, Urine 02/13/2019 143  20 - 320 mg/dL Final  . Microalb, Ur 02/13/2019 0.9  mg/dL Final   Comment: Reference Range Not established   . Microalb Creat Ratio 02/13/2019 6  <30 mcg/mg creat Final   Comment: . The ADA defines abnormalities in albumin excretion as follows: Marland Kitchen Category         Result (mcg/mg creatinine) . Normal                    <30 Microalbuminuria         30-299  Clinical albuminuria   > OR = 300 . The ADA recommends that at least two of three  specimens collected within a 3-6 month period be abnormal before considering a patient to be within a diagnostic category.     Past Medical History:  Diagnosis Date  . Diabetes mellitus without complication ([facility])   . Hypertension    Past Surgical History:  Procedure Laterality Date  . INCISION AND DRAINAGE PERIRECTAL ABSCESS N/A 02/24/2016   Procedure: IRRIGATION AND DEBRIDEMENT PERIRECTAL ABSCESS AND ANAL FISTULA;  Surgeon: Jackolyn Confer, MD;  Location: [facility];  Service: General;  Laterality: N/A;  . thumb surgery     in high school , hx injury playing football   Current Outpatient Medications on File Prior to Visit  Medication Sig Dispense Refill  . aspirin 81 MG EC tablet TAKE 1 TABLET (81 MG TOTAL) BY MOUTH DAILY. SWALLOW WHOLE. 90 tablet 2  . blood glucose meter kit and supplies Dispense based on patient and insurance preference. Use up to four times daily as directed. (FOR ICD-9 250.00, 250.01). 1 each 0  . glucose blood (ONE TOUCH ULTRA TEST) test strip Use as instructed to monitor FSBS 1x daily. Dx: E11.9. 100 each 12  . lisinopril (ZESTRIL) 10 MG tablet Take 1 tablet (10 mg total) by mouth daily. 90 tablet 3  . metFORMIN (GLUCOPHAGE) 1000 MG tablet TAKE 1 TABLET (1,000 MG TOTAL) BY MOUTH 2 (TWO) TIMES DAILY WITH A MEAL. 180 tablet 0  . OneTouch Delica Lancets 95K MISC Use as instructed to monitor FSBS 1x daily. Dx: E11.9. 100 each 11  . simvastatin (ZOCOR) 10 MG tablet TAKE 1 TABLET BY MOUTH  EVERYDAY AT BEDTIME 90 tablet 1  . sitaGLIPtin (JANUVIA) 100 MG tablet TAKE 1 TABLET BY MOUTH EVERY DAY IN THE MORNING 90 tablet 1   Current Facility-Administered Medications on File Prior to Visit  Medication Dose Route Frequency Provider Last Rate Last Admin  . 0.9 %  sodium chloride infusion  500 mL Intravenous Continuous Milus Banister, MD       No Known Allergies Social History   Socioeconomic History  . Marital status: Married    Spouse name: Not on file  . Number of children: Not on file  . Years of education: Not on file  . Highest education level: Not on file  Occupational History  . Not on file  Tobacco Use  . Smoking status: Former Smoker    Types: Cigars    Quit date: 01/19/2016    Years since quitting: 3.0  . Smokeless tobacco: Never Used  Substance and Sexual Activity  . Alcohol use: Yes    Comment: Social  . Drug use: No  . Sexual activity: Not on file  Other Topics Concern  . Not on file  Social History Narrative  . Not on file   Social Determinants of Health   Financial Resource Strain:   . Difficulty of Paying Living Expenses: Not on file  Food Insecurity:   . Worried About Charity fundraiser in the Last Year: Not on file  . Ran Out of Food in the Last Year: Not on file  Transportation Needs:   . Lack of Transportation (Medical): Not on file  . Lack of Transportation (Non-Medical): Not on file  Physical Activity:   . Days of Exercise per Week: Not on file  . Minutes of Exercise per Session: Not on file  Stress:   . Feeling of Stress : Not on file  Social Connections:   . Frequency of Communication with Friends and Family: Not on file  . Frequency of  Social Gatherings with Friends and Family: Not on file  . Attends Religious Services: Not on file  . Active Member of Clubs or Organizations: Not on file  . Attends Archivist Meetings: Not on file  . Marital Status: Not on file  Intimate Partner Violence:   . Fear of Current or  Ex-Partner: Not on file  . Emotionally Abused: Not on file  . Physically Abused: Not on file  . Sexually Abused: Not on file     Review of Systems  All other systems reviewed and are negative.      Objective:   Physical Exam Vitals reviewed.  Constitutional:      General: He is not in acute distress.    Appearance: Normal appearance. He is well-developed. He is not ill-appearing, toxic-appearing or diaphoretic.  HENT:     Head: Normocephalic and atraumatic.     Right Ear: Tympanic membrane, ear canal and external ear normal. There is no impacted cerumen.     Left Ear: Tympanic membrane, ear canal and external ear normal. There is no impacted cerumen.     Nose: Nose normal. No congestion or rhinorrhea.     Mouth/Throat:     Mouth: Mucous membranes are moist.     Pharynx: Oropharynx is clear. No oropharyngeal exudate or posterior oropharyngeal erythema.  Eyes:     General: No scleral icterus.       Right eye: No discharge.        Left eye: No discharge.     Extraocular Movements: Extraocular movements intact.     Conjunctiva/sclera: Conjunctivae normal.     Pupils: Pupils are equal, round, and reactive to light.  Neck:     Thyroid: No thyromegaly.     Vascular: No carotid bruit or JVD.  Cardiovascular:     Rate and Rhythm: Normal rate and regular rhythm.     Pulses: Normal pulses.     Heart sounds: Normal heart sounds. No murmur. No friction rub. No gallop.   Pulmonary:     Effort: Pulmonary effort is normal. No respiratory distress.     Breath sounds: Normal breath sounds. No stridor. No wheezing, rhonchi or rales.  Chest:     Chest wall: No tenderness.  Abdominal:     General: Bowel sounds are normal. There is no distension.     Palpations: Abdomen is soft. There is no mass.     Tenderness: There is no abdominal tenderness. There is no guarding or rebound.  Musculoskeletal:        General: No swelling, tenderness, deformity or signs of injury.     Cervical back:  Neck supple. No rigidity.     Right lower leg: No edema.     Left lower leg: No edema.  Lymphadenopathy:     Cervical: No cervical adenopathy.  Skin:    General: Skin is warm.     Coloration: Skin is not jaundiced or pale.     Findings: No bruising, erythema, lesion or rash.  Neurological:     General: No focal deficit present.     Mental Status: He is alert and oriented to person, place, and time. Mental status is at baseline.     Cranial Nerves: No cranial nerve deficit.     Sensory: No sensory deficit.     Motor: No weakness.     Coordination: Coordination normal.     Gait: Gait normal.     Deep Tendon Reflexes: Reflexes normal.  Psychiatric:  Mood and Affect: Mood normal.        Behavior: Behavior normal.        Thought Content: Thought content normal.        Judgment: Judgment normal.           Assessment & Plan:  Type 2 diabetes mellitus without complication, without long-term current use of insulin (HCC)  Hyperlipidemia, unspecified hyperlipidemia type  Essential hypertension  Prostate cancer screening  Physical exam today is completely normal.  Blood pressure is excellent.  Diabetic foot exam is normal.  Immunizations are up-to-date.  Recommended diabetic eye exam.  I did recommend that we discontinue Januvia and replace with Jardiance 25 mg a day as I feel that this is likely a more effective drug at managing his blood sugars.  We also discussed dietary changes and lifestyle changes that can help lower his hemoglobin A1c.  I recommended that he come back in for fasting lab work in 3 to 50-monthso that we can recheck to ensure that he is doing better controlling his blood sugars with these changes.  PSA is outstanding.  Colonoscopy is up-to-date.

## 2019-03-03 ENCOUNTER — Telehealth: Payer: Self-pay | Admitting: Family Medicine

## 2019-03-03 MED ORDER — EMPAGLIFLOZIN 25 MG PO TABS: 25.0000 mg | ORAL_TABLET | Freq: Every day | ORAL | 3 refills | Status: DC

## 2019-03-03 NOTE — Telephone Encounter (Signed)
FYI - Pt called and states that he is having no issues with the Jardiance and his blood sugars are better also.   I updated med list and sent rx to pharm.

## 2019-03-05 ENCOUNTER — Other Ambulatory Visit: Payer: Self-pay | Admitting: Family Medicine

## 2019-03-05 DIAGNOSIS — E119 Type 2 diabetes mellitus without complications: Secondary | ICD-10-CM

## 2019-04-14 ENCOUNTER — Other Ambulatory Visit: Payer: Self-pay | Admitting: Family Medicine

## 2019-06-05 ENCOUNTER — Other Ambulatory Visit: Payer: Self-pay | Admitting: Family Medicine

## 2019-07-08 ENCOUNTER — Other Ambulatory Visit: Payer: Self-pay | Admitting: Family Medicine

## 2019-08-05 ENCOUNTER — Other Ambulatory Visit: Payer: Self-pay | Admitting: Family Medicine

## 2019-08-05 DIAGNOSIS — I10 Essential (primary) hypertension: Secondary | ICD-10-CM

## 2019-08-05 DIAGNOSIS — E119 Type 2 diabetes mellitus without complications: Secondary | ICD-10-CM

## 2019-08-27 ENCOUNTER — Other Ambulatory Visit: Payer: Self-pay | Admitting: Family Medicine

## 2019-08-27 DIAGNOSIS — E119 Type 2 diabetes mellitus without complications: Secondary | ICD-10-CM

## 2019-09-20 ENCOUNTER — Other Ambulatory Visit: Payer: Self-pay | Admitting: Family Medicine

## 2019-09-20 DIAGNOSIS — E119 Type 2 diabetes mellitus without complications: Secondary | ICD-10-CM

## 2019-10-05 ENCOUNTER — Other Ambulatory Visit: Payer: Self-pay

## 2019-10-05 ENCOUNTER — Other Ambulatory Visit: Payer: BC Managed Care – PPO

## 2019-10-05 DIAGNOSIS — E119 Type 2 diabetes mellitus without complications: Secondary | ICD-10-CM | POA: Diagnosis not present

## 2019-10-05 DIAGNOSIS — E785 Hyperlipidemia, unspecified: Secondary | ICD-10-CM | POA: Diagnosis not present

## 2019-10-05 DIAGNOSIS — I10 Essential (primary) hypertension: Secondary | ICD-10-CM | POA: Diagnosis not present

## 2019-10-06 LAB — CBC WITH DIFFERENTIAL/PLATELET
Absolute Monocytes: 542 cells/uL (ref 200–950)
Basophils Absolute: 51 cells/uL (ref 0–200)
Basophils Relative: 1.3 %
Eosinophils Absolute: 31 cells/uL (ref 15–500)
Eosinophils Relative: 0.8 %
HCT: 46.4 % (ref 38.5–50.0)
Hemoglobin: 14.3 g/dL (ref 13.2–17.1)
Lymphs Abs: 1927 cells/uL (ref 850–3900)
MCH: 23.1 pg — ABNORMAL LOW (ref 27.0–33.0)
MCHC: 30.8 g/dL — ABNORMAL LOW (ref 32.0–36.0)
MCV: 75 fL — ABNORMAL LOW (ref 80.0–100.0)
MPV: 10.5 fL (ref 7.5–12.5)
Monocytes Relative: 13.9 %
Neutro Abs: 1349 cells/uL — ABNORMAL LOW (ref 1500–7800)
Neutrophils Relative %: 34.6 %
Platelets: 213 10*3/uL (ref 140–400)
RBC: 6.19 10*6/uL — ABNORMAL HIGH (ref 4.20–5.80)
RDW: 18.5 % — ABNORMAL HIGH (ref 11.0–15.0)
Total Lymphocyte: 49.4 %
WBC: 3.9 10*3/uL (ref 3.8–10.8)

## 2019-10-06 LAB — COMPLETE METABOLIC PANEL WITH GFR
AG Ratio: 1.8 (calc) (ref 1.0–2.5)
ALT: 20 U/L (ref 9–46)
AST: 19 U/L (ref 10–35)
Albumin: 4.9 g/dL (ref 3.6–5.1)
Alkaline phosphatase (APISO): 45 U/L (ref 35–144)
BUN/Creatinine Ratio: 20 (calc) (ref 6–22)
BUN: 28 mg/dL — ABNORMAL HIGH (ref 7–25)
CO2: 23 mmol/L (ref 20–32)
Calcium: 9.7 mg/dL (ref 8.6–10.3)
Chloride: 101 mmol/L (ref 98–110)
Creat: 1.39 mg/dL — ABNORMAL HIGH (ref 0.70–1.25)
GFR, Est African American: 63 mL/min/{1.73_m2} (ref >=60)
GFR, Est Non African American: 54 mL/min/{1.73_m2} — ABNORMAL LOW (ref >=60)
Globulin: 2.7 g/dL (calc) (ref 1.9–3.7)
Glucose, Bld: 132 mg/dL — ABNORMAL HIGH (ref 65–99)
Potassium: 4.8 mmol/L (ref 3.5–5.3)
Sodium: 137 mmol/L (ref 135–146)
Total Bilirubin: 0.5 mg/dL (ref 0.2–1.2)
Total Protein: 7.6 g/dL (ref 6.1–8.1)

## 2019-10-06 LAB — HEMOGLOBIN A1C
Hgb A1c MFr Bld: 7.5 % of total Hgb — ABNORMAL HIGH (ref <5.7)
Mean Plasma Glucose: 169 (calc)
eAG (mmol/L): 9.3 (calc)

## 2019-10-06 LAB — LIPID PANEL
Cholesterol: 169 mg/dL (ref <200)
HDL: 42 mg/dL (ref >=40)
LDL Cholesterol (Calc): 101 mg/dL (calc) — ABNORMAL HIGH
Non-HDL Cholesterol (Calc): 127 mg/dL (calc) (ref <130)
Total CHOL/HDL Ratio: 4 (calc) (ref <5.0)
Triglycerides: 164 mg/dL — ABNORMAL HIGH (ref <150)

## 2019-10-08 ENCOUNTER — Other Ambulatory Visit: Payer: Self-pay

## 2019-10-08 ENCOUNTER — Ambulatory Visit (INDEPENDENT_AMBULATORY_CARE_PROVIDER_SITE_OTHER): Payer: BC Managed Care – PPO | Admitting: Family Medicine

## 2019-10-08 VITALS — BP 104/70 | HR 73 | Temp 98.2°F | Ht 69.0 in | Wt 200.0 lb

## 2019-10-08 DIAGNOSIS — Z23 Encounter for immunization: Secondary | ICD-10-CM

## 2019-10-08 DIAGNOSIS — N289 Disorder of kidney and ureter, unspecified: Secondary | ICD-10-CM | POA: Diagnosis not present

## 2019-10-08 DIAGNOSIS — E1165 Type 2 diabetes mellitus with hyperglycemia: Secondary | ICD-10-CM | POA: Diagnosis not present

## 2019-10-08 MED ORDER — EMPAGLIFLOZIN 25 MG PO TABS
ORAL_TABLET | ORAL | 3 refills | Status: DC
Start: 2019-10-08 — End: 2020-11-01

## 2019-10-08 MED ORDER — SITAGLIPTIN PHOSPHATE 100 MG PO TABS: 100.0000 mg | ORAL_TABLET | Freq: Every day | ORAL | 3 refills | Status: DC

## 2019-10-08 NOTE — Progress Notes (Signed)
Subjective:    Patient ID: Paul Nichols, male    DOB: Jul 25, 1955, 64 y.o.   MRN: [id number]  HPI 03/01/19 Patient is a very pleasant 64 year old African-American gentleman here today for complete physical exam.  Past medical history is significant for diabetes mellitus that previously has been well controlled.  However on his most recent lab work his labs were significant for hemoglobin A1c of 7.8.  His HDL cholesterol is also slightly low at 38.  Otherwise his labs look outstanding.  He is scheduling his own diabetic eye exam.  He schedules this once a year.  His diabetic foot exam was performed today and is outstanding showing no evidence of peripheral vascular disease or neuropathy.  His immunizations are up-to-date as dictated below.  His colonoscopy was performed in 2019 and is not due again until 2029.  At that time, my plan was: Physical exam today is completely normal.  Blood pressure is excellent.  Diabetic foot exam is normal.  Immunizations are up-to-date.  Recommended diabetic eye exam.  I did recommend that we discontinue Januvia and replace with Jardiance 25 mg a day as I feel that this is likely a more effective drug at managing his blood sugars.  We also discussed dietary changes and lifestyle changes that can help lower his hemoglobin A1c.  I recommended that he come back in for fasting lab work in 3 to 58-monthso that we can recheck to ensure that he is doing better controlling his blood sugars with these changes.  PSA is outstanding.  Colonoscopy is up-to-date.  10/08/19  Patient is a very pleasant 64 year old African-American gentleman who is here today for follow-up.  At his last visit we switched Januvia to Jardiance in an effort to try to improve his A1c.  His A1c did drop but only slightly from 7.8-7.5.  His renal function also deteriorated slightly.  He has lost significant weight with the change.  I question if some of the decline in his renal function may be due to dehydration  from the [place]  He denies using any NSAIDs.  There is no hematuria or dysuria.  He denies any chest pain or shortness of breath or dyspnea on exertion.  He has had his Covid vaccination.  He is due for his flu shot.  The remainder of his lab work is listed below Lab on 10/05/2019  Component Date Value Ref Range Status  . WBC 10/05/2019 3.9  3.8 - 10.8 Thousand/uL Final  . RBC 10/05/2019 6.19* 4.20 - 5.80 Million/uL Final  . Hemoglobin 10/05/2019 14.3  13.2 - 17.1 g/dL Final  . HCT 10/05/2019 46.4  38 - 50 % Final  . MCV 10/05/2019 75.0* 80.0 - 100.0 fL Final  . MCH 10/05/2019 23.1* 27.0 - 33.0 pg Final  . MCHC 10/05/2019 30.8* 32.0 - 36.0 g/dL Final  . RDW 10/05/2019 18.5* 11.0 - 15.0 % Final  . Platelets 10/05/2019 213  140 - 400 Thousand/uL Final  . MPV 10/05/2019 10.5  7.5 - 12.5 fL Final  . Neutro Abs 10/05/2019 1,349* 1,500 - 7,800 cells/uL Final  . Lymphs Abs 10/05/2019 1,927  850 - 3,900 cells/uL Final  . Absolute Monocytes 10/05/2019 542  200 - 950 cells/uL Final  . Eosinophils Absolute 10/05/2019 31  15 - 500 cells/uL Final  . Basophils Absolute 10/05/2019 51  0 - 200 cells/uL Final  . Neutrophils Relative % 10/05/2019 34.6  % Final  . Total Lymphocyte 10/05/2019 49.4  % Final  . Monocytes Relative  10/05/2019 13.9  % Final  . Eosinophils Relative 10/05/2019 0.8  % Final  . Basophils Relative 10/05/2019 1.3  % Final  . Glucose, Bld 10/05/2019 132* 65 - 99 mg/dL Final   Comment: .            Fasting reference interval . For someone without known diabetes, a glucose value >125 mg/dL indicates that they may have diabetes and this should be confirmed with a follow-up test. .   . BUN 10/05/2019 28* 7 - 25 mg/dL Final  . Creat 10/05/2019 1.39* 0.70 - 1.25 mg/dL Final   Comment: For patients >15 years of age, the reference limit for Creatinine is approximately 13% higher for people identified as African-American. .   . GFR, Est Non African American 10/05/2019 54* > OR =  60 mL/min/1.17m Final  . GFR, Est African American 10/05/2019 63  > OR = 60 mL/min/1.729mFinal  . BUN/Creatinine Ratio 10/05/2019 20  6 - 22 (calc) Final  . Sodium 10/05/2019 137  135 - 146 mmol/L Final  . Potassium 10/05/2019 4.8  3.5 - 5.3 mmol/L Final  . Chloride 10/05/2019 101  98 - 110 mmol/L Final  . CO2 10/05/2019 23  20 - 32 mmol/L Final  . Calcium 10/05/2019 9.7  8.6 - 10.3 mg/dL Final  . Total Protein 10/05/2019 7.6  6.1 - 8.1 g/dL Final  . Albumin 10/05/2019 4.9  3.6 - 5.1 g/dL Final  . Globulin 10/05/2019 2.7  1.9 - 3.7 g/dL (calc) Final  . AG Ratio 10/05/2019 1.8  1.0 - 2.5 (calc) Final  . Total Bilirubin 10/05/2019 0.5  0.2 - 1.2 mg/dL Final  . Alkaline phosphatase (APISO) 10/05/2019 45  35 - 144 U/L Final  . AST 10/05/2019 19  10 - 35 U/L Final  . ALT 10/05/2019 20  9 - 46 U/L Final  . Hgb A1c MFr Bld 10/05/2019 7.5* <5.7 % of total Hgb Final   Comment: For someone without known diabetes, a hemoglobin A1c value of 6.5% or greater indicates that they may have  diabetes and this should be confirmed with a follow-up  test. . For someone with known diabetes, a value <7% indicates  that their diabetes is well controlled and a value  greater than or equal to 7% indicates suboptimal  control. A1c targets should be individualized based on  duration of diabetes, age, comorbid conditions, and  other considerations. . Currently, no consensus exists regarding use of hemoglobin A1c for diagnosis of diabetes for children. .   . Mean Plasma Glucose 10/05/2019 169  (calc) Final  . eAG (mmol/L) 10/05/2019 9.3  (calc) Final  . Cholesterol 10/05/2019 169  <200 mg/dL Final  . HDL 10/05/2019 42  > OR = 40 mg/dL Final  . Triglycerides 10/05/2019 164* <150 mg/dL Final  . LDL Cholesterol (Calc) 10/05/2019 101* mg/dL (calc) Final   Comment: Reference range: <100 . Desirable range <100 mg/dL for primary prevention;   <70 mg/dL for patients with CHD or diabetic patients  with > or =  2 CHD risk factors. . Marland KitchenDL-C is now calculated using the Martin-Hopkins  calculation, which is a validated novel method providing  better accuracy than the Friedewald equation in the  estimation of LDL-C.  MaCresenciano Genret al. JAAnnamaria [id number]  (http://education.[facility].com/faq/FAQ164)   . Total CHOL/HDL Ratio 10/05/2019 4.0  <5.0 (calc) Final  . Non-HDL Cholesterol (Calc) 10/05/2019 127  <130 mg/dL (calc) Final   Comment: For patients with diabetes plus 1 major ASCVD risk  factor, treating to a non-HDL-C goal of <100 mg/dL  (LDL-C of <70 mg/dL) is considered a therapeutic  option.     Past Medical History:  Diagnosis Date  . Diabetes mellitus without complication ([place])   . Hypertension    Past Surgical History:  Procedure Laterality Date  . INCISION AND DRAINAGE PERIRECTAL ABSCESS N/A 02/24/2016   Procedure: IRRIGATION AND DEBRIDEMENT PERIRECTAL ABSCESS AND ANAL FISTULA;  Surgeon: Jackolyn Confer, MD;  Location: [facility];  Service: General;  Laterality: N/A;  . thumb surgery     in high school , hx injury playing football   Current Outpatient Medications on File Prior to Visit  Medication Sig Dispense Refill  . aspirin 81 MG EC tablet TAKE 1 TABLET (81 MG TOTAL) BY MOUTH DAILY. SWALLOW WHOLE. 90 tablet 2  . blood glucose meter kit and supplies Dispense based on patient and insurance preference. Use up to four times daily as directed. (FOR ICD-9 250.00, 250.01). 1 each 0  . glucose blood (ONE TOUCH ULTRA TEST) test strip Use as instructed to monitor FSBS 1x daily. Dx: E11.9. 100 each 12  . JARDIANCE 25 MG TABS tablet TAKE 1 TABLET BY MOUTH DAILY BEFORE BREAKFAST. 30 tablet 3  . lisinopril (ZESTRIL) 10 MG tablet TAKE 1 TABLET BY MOUTH EVERY DAY 90 tablet 3  . metFORMIN (GLUCOPHAGE) 1000 MG tablet TAKE 1 TABLET BY MOUTH TWICE A DAY WITH MEALS 60 tablet 0  . OneTouch Delica Lancets 25O MISC Use as instructed to monitor FSBS 1x daily. Dx: E11.9. 100 each 11  .  simvastatin (ZOCOR) 10 MG tablet TAKE 1 TABLET BY MOUTH EVERYDAY AT BEDTIME 90 tablet 1   Current Facility-Administered Medications on File Prior to Visit  Medication Dose Route Frequency Provider Last Rate Last Admin  . 0.9 %  sodium chloride infusion  500 mL Intravenous Continuous Milus Banister, MD       No Known Allergies Social History   Socioeconomic History  . Marital status: Married    Spouse name: Not on file  . Number of children: Not on file  . Years of education: Not on file  . Highest education level: Not on file  Occupational History  . Not on file  Tobacco Use  . Smoking status: Former Smoker    Types: Cigars    Quit date: 01/19/2016    Years since quitting: 3.7  . Smokeless tobacco: Never Used  Substance and Sexual Activity  . Alcohol use: Yes    Comment: Social  . Drug use: No  . Sexual activity: Not on file  Other Topics Concern  . Not on file  Social History Narrative  . Not on file   Social Determinants of Health   Financial Resource Strain:   . Difficulty of Paying Living Expenses: Not on file  Food Insecurity:   . Worried About Charity fundraiser in the Last Year: Not on file  . Ran Out of Food in the Last Year: Not on file  Transportation Needs:   . Lack of Transportation (Medical): Not on file  . Lack of Transportation (Non-Medical): Not on file  Physical Activity:   . Days of Exercise per Week: Not on file  . Minutes of Exercise per Session: Not on file  Stress:   . Feeling of Stress : Not on file  Social Connections:   . Frequency of Communication with Friends and Family: Not on file  . Frequency of Social Gatherings with Friends and Family: Not on  file  . Attends Religious Services: Not on file  . Active Member of Clubs or Organizations: Not on file  . Attends Archivist Meetings: Not on file  . Marital Status: Not on file  Intimate Partner Violence:   . Fear of Current or Ex-Partner: Not on file  . Emotionally Abused:  Not on file  . Physically Abused: Not on file  . Sexually Abused: Not on file     Review of Systems  All other systems reviewed and are negative.      Objective:   Physical Exam Vitals reviewed.  Constitutional:      General: He is not in acute distress.    Appearance: Normal appearance. He is well-developed. He is not ill-appearing, toxic-appearing or diaphoretic.  HENT:     Head: Normocephalic and atraumatic.     Right Ear: Tympanic membrane, ear canal and external ear normal. There is no impacted cerumen.     Left Ear: Tympanic membrane, ear canal and external ear normal. There is no impacted cerumen.     Nose: Nose normal. No congestion or rhinorrhea.     Mouth/Throat:     Mouth: Mucous membranes are moist.     Pharynx: Oropharynx is clear. No oropharyngeal exudate or posterior oropharyngeal erythema.  Eyes:     General: No scleral icterus.       Right eye: No discharge.        Left eye: No discharge.     Extraocular Movements: Extraocular movements intact.     Conjunctiva/sclera: Conjunctivae normal.     Pupils: Pupils are equal, round, and reactive to light.  Neck:     Thyroid: No thyromegaly.     Vascular: No carotid bruit or JVD.  Cardiovascular:     Rate and Rhythm: Normal rate and regular rhythm.     Pulses: Normal pulses.     Heart sounds: Normal heart sounds. No murmur heard.  No friction rub. No gallop.   Pulmonary:     Effort: Pulmonary effort is normal. No respiratory distress.     Breath sounds: Normal breath sounds. No stridor. No wheezing, rhonchi or rales.  Chest:     Chest wall: No tenderness.  Abdominal:     General: Bowel sounds are normal. There is no distension.     Palpations: Abdomen is soft. There is no mass.     Tenderness: There is no abdominal tenderness. There is no guarding or rebound.  Musculoskeletal:        General: No swelling, tenderness, deformity or signs of injury.     Cervical back: Neck supple. No rigidity.     Right lower  leg: No edema.     Left lower leg: No edema.  Lymphadenopathy:     Cervical: No cervical adenopathy.  Skin:    General: Skin is warm.     Coloration: Skin is not jaundiced or pale.     Findings: No bruising, erythema, lesion or rash.  Neurological:     General: No focal deficit present.     Mental Status: He is alert and oriented to person, place, and time. Mental status is at baseline.     Cranial Nerves: No cranial nerve deficit.     Sensory: No sensory deficit.     Motor: No weakness.     Coordination: Coordination normal.     Gait: Gait normal.     Deep Tendon Reflexes: Reflexes normal.  Psychiatric:        Mood and  Affect: Mood normal.        Behavior: Behavior normal.        Thought Content: Thought content normal.        Judgment: Judgment normal.           Assessment & Plan:  Renal insufficiency  Uncontrolled type 2 diabetes mellitus with hyperglycemia ([place])  Majority of our visit today was spent in discussion.  I outlined for him his options including glipizide, Actos, Januvia, or GLP-1.  After discussing the risk and benefits of all of his options, he elects to resume Januvia 100 mg a day in addition to his Metformin and his Jardiance.  We will recheck a BMP to monitor his renal function along with an hemoglobin A1c in 3 months.  Patient received his flu shot today.  Did encourage him to avoid NSAIDs and to try to drink more water.

## 2019-10-16 ENCOUNTER — Other Ambulatory Visit: Payer: Self-pay | Admitting: Family Medicine

## 2019-10-16 DIAGNOSIS — E119 Type 2 diabetes mellitus without complications: Secondary | ICD-10-CM

## 2019-11-09 ENCOUNTER — Other Ambulatory Visit: Payer: Self-pay | Admitting: Family Medicine

## 2019-11-09 DIAGNOSIS — E119 Type 2 diabetes mellitus without complications: Secondary | ICD-10-CM

## 2019-11-20 ENCOUNTER — Other Ambulatory Visit: Payer: Self-pay

## 2019-11-20 DIAGNOSIS — E119 Type 2 diabetes mellitus without complications: Secondary | ICD-10-CM

## 2019-11-20 MED ORDER — METFORMIN HCL 1000 MG PO TABS: 1000.0000 mg | ORAL_TABLET | Freq: Two times a day (BID) | ORAL | 3 refills | Status: DC

## 2019-11-24 ENCOUNTER — Other Ambulatory Visit: Payer: Self-pay | Admitting: *Deleted

## 2019-11-24 DIAGNOSIS — E119 Type 2 diabetes mellitus without complications: Secondary | ICD-10-CM

## 2019-11-24 MED ORDER — METFORMIN HCL 1000 MG PO TABS: 1000.0000 mg | ORAL_TABLET | Freq: Two times a day (BID) | ORAL | 3 refills | Status: DC

## 2020-01-07 ENCOUNTER — Other Ambulatory Visit: Payer: BC Managed Care – PPO

## 2020-01-15 ENCOUNTER — Other Ambulatory Visit: Payer: BC Managed Care – PPO

## 2020-01-15 ENCOUNTER — Other Ambulatory Visit: Payer: Self-pay

## 2020-01-15 DIAGNOSIS — I10 Essential (primary) hypertension: Secondary | ICD-10-CM

## 2020-01-15 DIAGNOSIS — E785 Hyperlipidemia, unspecified: Secondary | ICD-10-CM | POA: Diagnosis not present

## 2020-01-15 DIAGNOSIS — E1165 Type 2 diabetes mellitus with hyperglycemia: Secondary | ICD-10-CM | POA: Diagnosis not present

## 2020-01-16 LAB — COMPLETE METABOLIC PANEL WITH GFR
AG Ratio: 1.5 (calc) (ref 1.0–2.5)
ALT: 18 U/L (ref 9–46)
AST: 15 U/L (ref 10–35)
Albumin: 4.8 g/dL (ref 3.6–5.1)
Alkaline phosphatase (APISO): 57 U/L (ref 35–144)
BUN/Creatinine Ratio: 18 (calc) (ref 6–22)
BUN: 25 mg/dL (ref 7–25)
CO2: 25 mmol/L (ref 20–32)
Calcium: 10.1 mg/dL (ref 8.6–10.3)
Chloride: 104 mmol/L (ref 98–110)
Creat: 1.38 mg/dL — ABNORMAL HIGH (ref 0.70–1.25)
GFR, Est African American: 63 mL/min/{1.73_m2} (ref >=60)
GFR, Est Non African American: 54 mL/min/{1.73_m2} — ABNORMAL LOW (ref >=60)
Globulin: 3.2 g/dL (calc) (ref 1.9–3.7)
Glucose, Bld: 114 mg/dL — ABNORMAL HIGH (ref 65–99)
Potassium: 4.7 mmol/L (ref 3.5–5.3)
Sodium: 139 mmol/L (ref 135–146)
Total Bilirubin: 0.2 mg/dL (ref 0.2–1.2)
Total Protein: 8 g/dL (ref 6.1–8.1)

## 2020-01-16 LAB — LIPID PANEL
Cholesterol: 156 mg/dL (ref <200)
HDL: 34 mg/dL — ABNORMAL LOW (ref >=40)
LDL Cholesterol (Calc): 85 mg/dL (calc)
Non-HDL Cholesterol (Calc): 122 mg/dL (calc) (ref <130)
Total CHOL/HDL Ratio: 4.6 (calc) (ref <5.0)
Triglycerides: 307 mg/dL — ABNORMAL HIGH (ref <150)

## 2020-01-16 LAB — CBC WITH DIFFERENTIAL/PLATELET
Absolute Monocytes: 477 cells/uL (ref 200–950)
Basophils Absolute: 30 cells/uL (ref 0–200)
Basophils Relative: 0.7 %
Eosinophils Absolute: 69 cells/uL (ref 15–500)
Eosinophils Relative: 1.6 %
HCT: 47.3 % (ref 38.5–50.0)
Hemoglobin: 15 g/dL (ref 13.2–17.1)
Lymphs Abs: 1578 cells/uL (ref 850–3900)
MCH: 23.8 pg — ABNORMAL LOW (ref 27.0–33.0)
MCHC: 31.7 g/dL — ABNORMAL LOW (ref 32.0–36.0)
MCV: 75.1 fL — ABNORMAL LOW (ref 80.0–100.0)
MPV: 11.7 fL (ref 7.5–12.5)
Monocytes Relative: 11.1 %
Neutro Abs: 2146 cells/uL (ref 1500–7800)
Neutrophils Relative %: 49.9 %
Platelets: 242 10*3/uL (ref 140–400)
RBC: 6.3 10*6/uL — ABNORMAL HIGH (ref 4.20–5.80)
RDW: 17.5 % — ABNORMAL HIGH (ref 11.0–15.0)
Total Lymphocyte: 36.7 %
WBC: 4.3 10*3/uL (ref 3.8–10.8)

## 2020-01-16 LAB — HEMOGLOBIN A1C
Hgb A1c MFr Bld: 7 % of total Hgb — ABNORMAL HIGH (ref <5.7)
Mean Plasma Glucose: 154 mg/dL
eAG (mmol/L): 8.5 mmol/L

## 2020-01-18 ENCOUNTER — Encounter: Payer: Self-pay | Admitting: *Deleted

## 2020-03-16 ENCOUNTER — Other Ambulatory Visit: Payer: Self-pay | Admitting: Family Medicine

## 2020-04-12 ENCOUNTER — Other Ambulatory Visit: Payer: Self-pay | Admitting: Family Medicine

## 2020-05-16 ENCOUNTER — Other Ambulatory Visit: Payer: Self-pay | Admitting: Family Medicine

## 2020-08-04 ENCOUNTER — Other Ambulatory Visit: Payer: Self-pay | Admitting: Family Medicine

## 2020-08-04 DIAGNOSIS — I10 Essential (primary) hypertension: Secondary | ICD-10-CM

## 2020-08-04 DIAGNOSIS — E119 Type 2 diabetes mellitus without complications: Secondary | ICD-10-CM

## 2020-09-20 ENCOUNTER — Other Ambulatory Visit: Payer: BC Managed Care – PPO

## 2020-09-20 ENCOUNTER — Other Ambulatory Visit: Payer: Self-pay

## 2020-09-20 DIAGNOSIS — E1165 Type 2 diabetes mellitus with hyperglycemia: Secondary | ICD-10-CM

## 2020-09-20 DIAGNOSIS — Z1159 Encounter for screening for other viral diseases: Secondary | ICD-10-CM

## 2020-09-20 DIAGNOSIS — Z125 Encounter for screening for malignant neoplasm of prostate: Secondary | ICD-10-CM | POA: Diagnosis not present

## 2020-09-20 DIAGNOSIS — I10 Essential (primary) hypertension: Secondary | ICD-10-CM | POA: Diagnosis not present

## 2020-09-20 DIAGNOSIS — E785 Hyperlipidemia, unspecified: Secondary | ICD-10-CM

## 2020-09-20 DIAGNOSIS — Z13220 Encounter for screening for lipoid disorders: Secondary | ICD-10-CM

## 2020-09-20 DIAGNOSIS — Z136 Encounter for screening for cardiovascular disorders: Secondary | ICD-10-CM

## 2020-09-21 ENCOUNTER — Other Ambulatory Visit: Payer: BC Managed Care – PPO

## 2020-09-21 LAB — MICROALBUMIN / CREATININE URINE RATIO
Creatinine, Urine: 94 mg/dL (ref 20–320)
Microalb Creat Ratio: 4 mcg/mg creat (ref <30)
Microalb, Ur: 0.4 mg/dL

## 2020-09-21 LAB — PSA: PSA: 0.98 ng/mL (ref <=4.00)

## 2020-09-21 LAB — HEMOGLOBIN A1C
Hgb A1c MFr Bld: 7.1 % of total Hgb — ABNORMAL HIGH (ref <5.7)
Mean Plasma Glucose: 157 mg/dL
eAG (mmol/L): 8.7 mmol/L

## 2020-09-21 LAB — CBC WITH DIFFERENTIAL/PLATELET
Absolute Monocytes: 456 cells/uL (ref 200–950)
Basophils Absolute: 38 cells/uL (ref 0–200)
Basophils Relative: 1 %
Eosinophils Absolute: 68 cells/uL (ref 15–500)
Eosinophils Relative: 1.8 %
HCT: 46.2 % (ref 38.5–50.0)
Hemoglobin: 14.4 g/dL (ref 13.2–17.1)
Lymphs Abs: 1645 cells/uL (ref 850–3900)
MCH: 23.5 pg — ABNORMAL LOW (ref 27.0–33.0)
MCHC: 31.2 g/dL — ABNORMAL LOW (ref 32.0–36.0)
MCV: 75.4 fL — ABNORMAL LOW (ref 80.0–100.0)
MPV: 10.8 fL (ref 7.5–12.5)
Monocytes Relative: 12 %
Neutro Abs: 1592 cells/uL (ref 1500–7800)
Neutrophils Relative %: 41.9 %
Platelets: 207 10*3/uL (ref 140–400)
RBC: 6.13 10*6/uL — ABNORMAL HIGH (ref 4.20–5.80)
RDW: 17.4 % — ABNORMAL HIGH (ref 11.0–15.0)
Total Lymphocyte: 43.3 %
WBC: 3.8 10*3/uL (ref 3.8–10.8)

## 2020-09-21 LAB — COMPLETE METABOLIC PANEL WITH GFR
AG Ratio: 1.6 (calc) (ref 1.0–2.5)
ALT: 19 U/L (ref 9–46)
AST: 18 U/L (ref 10–35)
Albumin: 4.7 g/dL (ref 3.6–5.1)
Alkaline phosphatase (APISO): 44 U/L (ref 35–144)
BUN/Creatinine Ratio: 18 (calc) (ref 6–22)
BUN: 24 mg/dL (ref 7–25)
CO2: 24 mmol/L (ref 20–32)
Calcium: 9.9 mg/dL (ref 8.6–10.3)
Chloride: 103 mmol/L (ref 98–110)
Creat: 1.37 mg/dL — ABNORMAL HIGH (ref 0.70–1.35)
Globulin: 3 g/dL (calc) (ref 1.9–3.7)
Glucose, Bld: 139 mg/dL — ABNORMAL HIGH (ref 65–99)
Potassium: 5 mmol/L (ref 3.5–5.3)
Sodium: 138 mmol/L (ref 135–146)
Total Bilirubin: 0.3 mg/dL (ref 0.2–1.2)
Total Protein: 7.7 g/dL (ref 6.1–8.1)
eGFR: 58 mL/min/{1.73_m2} — ABNORMAL LOW (ref >=60)

## 2020-09-21 LAB — LIPID PANEL
Cholesterol: 147 mg/dL (ref <200)
HDL: 37 mg/dL — ABNORMAL LOW (ref >=40)
LDL Cholesterol (Calc): 85 mg/dL (calc)
Non-HDL Cholesterol (Calc): 110 mg/dL (calc) (ref <130)
Total CHOL/HDL Ratio: 4 (calc) (ref <5.0)
Triglycerides: 155 mg/dL — ABNORMAL HIGH (ref <150)

## 2020-09-21 LAB — HEPATITIS C ANTIBODY
Hepatitis C Ab: NONREACTIVE
SIGNAL TO CUT-OFF: 0.02 (ref <1.00)

## 2020-09-23 ENCOUNTER — Other Ambulatory Visit: Payer: Self-pay

## 2020-09-23 ENCOUNTER — Ambulatory Visit (INDEPENDENT_AMBULATORY_CARE_PROVIDER_SITE_OTHER): Payer: BC Managed Care – PPO | Admitting: Family Medicine

## 2020-09-23 ENCOUNTER — Encounter: Payer: Self-pay | Admitting: Family Medicine

## 2020-09-23 VITALS — BP 128/72 | HR 72 | Temp 98.2°F | Resp 14 | Ht 69.0 in | Wt 202.0 lb

## 2020-09-23 DIAGNOSIS — Z0000 Encounter for general adult medical examination without abnormal findings: Secondary | ICD-10-CM | POA: Diagnosis not present

## 2020-09-23 DIAGNOSIS — E119 Type 2 diabetes mellitus without complications: Secondary | ICD-10-CM | POA: Diagnosis not present

## 2020-09-23 DIAGNOSIS — E785 Hyperlipidemia, unspecified: Secondary | ICD-10-CM | POA: Diagnosis not present

## 2020-09-23 DIAGNOSIS — Z23 Encounter for immunization: Secondary | ICD-10-CM | POA: Diagnosis not present

## 2020-09-23 DIAGNOSIS — Z125 Encounter for screening for malignant neoplasm of prostate: Secondary | ICD-10-CM

## 2020-09-23 DIAGNOSIS — I10 Essential (primary) hypertension: Secondary | ICD-10-CM

## 2020-09-23 NOTE — Progress Notes (Signed)
Subjective:    Patient ID: Paul Nichols, male    DOB: 03-27-1955, 66 y.o.   MRN: [id number]  HPI  Patient is a very pleasant 65 year old African-American gentleman who is here today for complete physical exam.  His last colonoscopy was performed in 2019 and is not due again until 2029.  His PSA was just recently checked and shown to be within normal limits at 0.98 up slightly from 0.8 from a year ago.  I reviewed his shot records and he is due for a flu shot which he received here today.  I also recommended the new COVID booster.  His most recent lab work is listed below Lab on 09/20/2020  Component Date Value Ref Range Status   WBC 09/20/2020 3.8  3.8 - 10.8 Thousand/uL Final   RBC 09/20/2020 6.13 (A) 4.20 - 5.80 Million/uL Final   Hemoglobin 09/20/2020 14.4  13.2 - 17.1 g/dL Final   HCT 09/20/2020 46.2  38.5 - 50.0 % Final   MCV 09/20/2020 75.4 (A) 80.0 - 100.0 fL Final   MCH 09/20/2020 23.5 (A) 27.0 - 33.0 pg Final   MCHC 09/20/2020 31.2 (A) 32.0 - 36.0 g/dL Final   RDW 09/20/2020 17.4 (A) 11.0 - 15.0 % Final   Platelets 09/20/2020 207  140 - 400 Thousand/uL Final   MPV 09/20/2020 10.8  7.5 - 12.5 fL Final   Neutro Abs 09/20/2020 1,592  1,500 - 7,800 cells/uL Final   Lymphs Abs 09/20/2020 1,645  850 - 3,900 cells/uL Final   Absolute Monocytes 09/20/2020 456  200 - 950 cells/uL Final   Eosinophils Absolute 09/20/2020 68  15 - 500 cells/uL Final   Basophils Absolute 09/20/2020 38  0 - 200 cells/uL Final   Neutrophils Relative % 09/20/2020 41.9  % Final   Total Lymphocyte 09/20/2020 43.3  % Final   Monocytes Relative 09/20/2020 12.0  % Final   Eosinophils Relative 09/20/2020 1.8  % Final   Basophils Relative 09/20/2020 1.0  % Final   Glucose, Bld 09/20/2020 139 (A) 65 - 99 mg/dL Final   Comment: .            Fasting reference interval . For someone without known diabetes, a glucose value >125 mg/dL indicates that they may have diabetes and this should be confirmed with  a follow-up test. .    BUN 09/20/2020 24  7 - 25 mg/dL Final   Creat 09/20/2020 1.37 (A) 0.70 - 1.35 mg/dL Final   eGFR 09/20/2020 58 (A) > OR = 60 mL/min/1.65m Final   Comment: The eGFR is based on the CKD-EPI 2021 equation. To calculate  the new eGFR from a previous Creatinine or Cystatin C result, go to https://www.kidney.org/professionals/ kdoqi/gfr%5Fcalculator    BUN/Creatinine Ratio 09/20/2020 18  6 - 22 (calc) Final   Sodium 09/20/2020 138  135 - 146 mmol/L Final   Potassium 09/20/2020 5.0  3.5 - 5.3 mmol/L Final   Chloride 09/20/2020 103  98 - 110 mmol/L Final   CO2 09/20/2020 24  20 - 32 mmol/L Final   Calcium 09/20/2020 9.9  8.6 - 10.3 mg/dL Final   Total Protein 09/20/2020 7.7  6.1 - 8.1 g/dL Final   Albumin 09/20/2020 4.7  3.6 - 5.1 g/dL Final   Globulin 09/20/2020 3.0  1.9 - 3.7 g/dL (calc) Final   AG Ratio 09/20/2020 1.6  1.0 - 2.5 (calc) Final   Total Bilirubin 09/20/2020 0.3  0.2 - 1.2 mg/dL Final   Alkaline phosphatase (APISO) 09/20/2020 44  35 -  144 U/L Final   AST 09/20/2020 18  10 - 35 U/L Final   ALT 09/20/2020 19  9 - 46 U/L Final   Hgb A1c MFr Bld 09/20/2020 7.1 (A) <5.7 % of total Hgb Final   Comment: For someone without known diabetes, a hemoglobin A1c value of 6.5% or greater indicates that they may have  diabetes and this should be confirmed with a follow-up  test. . For someone with known diabetes, a value <7% indicates  that their diabetes is well controlled and a value  greater than or equal to 7% indicates suboptimal  control. A1c targets should be individualized based on  duration of diabetes, age, comorbid conditions, and  other considerations. . Currently, no consensus exists regarding use of hemoglobin A1c for diagnosis of diabetes for children. .    Mean Plasma Glucose 09/20/2020 157  mg/dL Final   eAG (mmol/L) 09/20/2020 8.7  mmol/L Final   Hepatitis C Ab 09/20/2020 NON-REACTIVE  NON-REACTIVE Final   SIGNAL TO CUT-OFF 09/20/2020  0.02  <1.00 Final   Comment: . HCV antibody was non-reactive. There is no laboratory  evidence of HCV infection. . In most cases, no further action is required. However, if recent HCV exposure is suspected, a test for HCV RNA (test code [phone]) is suggested. . For additional information please refer to http://education.questdiagnostics.com/faq/FAQ22v1 (This link is being provided for informational/ educational purposes only.) .    Cholesterol 09/20/2020 147  <200 mg/dL Final   HDL 09/20/2020 37 (A) > OR = 40 mg/dL Final   Triglycerides 09/20/2020 155 (A) <150 mg/dL Final   LDL Cholesterol (Calc) 09/20/2020 85  mg/dL (calc) Final   Comment: Reference range: <100 . Desirable range <100 mg/dL for primary prevention;   <70 mg/dL for patients with CHD or diabetic patients  with > or = 2 CHD risk factors. Marland Kitchen LDL-C is now calculated using the Martin-Hopkins  calculation, which is a validated novel method providing  better accuracy than the Friedewald equation in the  estimation of LDL-C.  Cresenciano Genre et al. Annamaria Helling. 4010;272(53): 2061-2068  (http://education.[facility].com/faq/FAQ164)    Total CHOL/HDL Ratio 09/20/2020 4.0  <5.0 (calc) Final   Non-HDL Cholesterol (Calc) 09/20/2020 110  <130 mg/dL (calc) Final   Comment: For patients with diabetes plus 1 major ASCVD risk  factor, treating to a non-HDL-C goal of <100 mg/dL  (LDL-C of <70 mg/dL) is considered a therapeutic  option.    Creatinine, Urine 09/20/2020 94  20 - 320 mg/dL Final   Microalb, Ur 09/20/2020 0.4  mg/dL Final   Comment: Reference Range Not established    Microalb Creat Ratio 09/20/2020 4  <30 mcg/mg creat Final   Comment: . The ADA defines abnormalities in albumin excretion as follows: Marland Kitchen Albuminuria Category        Result (mcg/mg creatinine) . Normal to Mildly increased   <30 Moderately increased         30-299  Severely increased           > OR = 300 . The ADA recommends that at least two of  three specimens collected within a 3-6 month period be abnormal before considering a patient to be within a diagnostic category.    PSA 09/20/2020 0.98  < OR = 4.00 ng/mL Final   Comment: The total PSA value from this assay system is  standardized against the WHO standard. The test  result will be approximately 20% lower when compared  to the equimolar-standardized total PSA (Beckman  Coulter).  Comparison of serial PSA results should be  interpreted with this fact in mind. . This test was performed using the Siemens  chemiluminescent method. Values obtained from  different assay methods cannot be used interchangeably. PSA levels, regardless of value, should not be interpreted as absolute evidence of the presence or absence of disease.     Immunization History  Administered Date(s) Administered   Influenza Inj Mdck Quad Pf 11/18/2017, 10/27/2018   Influenza,inj,Quad PF,6+ Mos 12/03/2016, 10/08/2019, 09/23/2020   PFIZER(Purple Top)SARS-COV-2 Vaccination 03/29/2019, 04/29/2019   Pneumococcal Polysaccharide-23 08/27/2016   Tdap 07/09/2014   Zoster Recombinat (Shingrix) 05/28/2016, 09/07/2016   Lab on 09/20/2020  Component Date Value Ref Range Status   WBC 09/20/2020 3.8  3.8 - 10.8 Thousand/uL Final   RBC 09/20/2020 6.13 (A) 4.20 - 5.80 Million/uL Final   Hemoglobin 09/20/2020 14.4  13.2 - 17.1 g/dL Final   HCT 09/20/2020 46.2  38.5 - 50.0 % Final   MCV 09/20/2020 75.4 (A) 80.0 - 100.0 fL Final   MCH 09/20/2020 23.5 (A) 27.0 - 33.0 pg Final   MCHC 09/20/2020 31.2 (A) 32.0 - 36.0 g/dL Final   RDW 09/20/2020 17.4 (A) 11.0 - 15.0 % Final   Platelets 09/20/2020 207  140 - 400 Thousand/uL Final   MPV 09/20/2020 10.8  7.5 - 12.5 fL Final   Neutro Abs 09/20/2020 1,592  1,500 - 7,800 cells/uL Final   Lymphs Abs 09/20/2020 1,645  850 - 3,900 cells/uL Final   Absolute Monocytes 09/20/2020 456  200 - 950 cells/uL Final   Eosinophils Absolute 09/20/2020 68  15 - 500 cells/uL Final    Basophils Absolute 09/20/2020 38  0 - 200 cells/uL Final   Neutrophils Relative % 09/20/2020 41.9  % Final   Total Lymphocyte 09/20/2020 43.3  % Final   Monocytes Relative 09/20/2020 12.0  % Final   Eosinophils Relative 09/20/2020 1.8  % Final   Basophils Relative 09/20/2020 1.0  % Final   Glucose, Bld 09/20/2020 139 (A) 65 - 99 mg/dL Final   Comment: .            Fasting reference interval . For someone without known diabetes, a glucose value >125 mg/dL indicates that they may have diabetes and this should be confirmed with a follow-up test. .    BUN 09/20/2020 24  7 - 25 mg/dL Final   Creat 09/20/2020 1.37 (A) 0.70 - 1.35 mg/dL Final   eGFR 09/20/2020 58 (A) > OR = 60 mL/min/1.67m Final   Comment: The eGFR is based on the CKD-EPI 2021 equation. To calculate  the new eGFR from a previous Creatinine or Cystatin C result, go to https://www.kidney.org/professionals/ kdoqi/gfr%5Fcalculator    BUN/Creatinine Ratio 09/20/2020 18  6 - 22 (calc) Final   Sodium 09/20/2020 138  135 - 146 mmol/L Final   Potassium 09/20/2020 5.0  3.5 - 5.3 mmol/L Final   Chloride 09/20/2020 103  98 - 110 mmol/L Final   CO2 09/20/2020 24  20 - 32 mmol/L Final   Calcium 09/20/2020 9.9  8.6 - 10.3 mg/dL Final   Total Protein 09/20/2020 7.7  6.1 - 8.1 g/dL Final   Albumin 09/20/2020 4.7  3.6 - 5.1 g/dL Final   Globulin 09/20/2020 3.0  1.9 - 3.7 g/dL (calc) Final   AG Ratio 09/20/2020 1.6  1.0 - 2.5 (calc) Final   Total Bilirubin 09/20/2020 0.3  0.2 - 1.2 mg/dL Final   Alkaline phosphatase (APISO) 09/20/2020 44  35 - 144 U/L Final   AST 09/20/2020  18  10 - 35 U/L Final   ALT 09/20/2020 19  9 - 46 U/L Final   Hgb A1c MFr Bld 09/20/2020 7.1 (A) <5.7 % of total Hgb Final   Comment: For someone without known diabetes, a hemoglobin A1c value of 6.5% or greater indicates that they may have  diabetes and this should be confirmed with a follow-up  test. . For someone with known diabetes, a value <7% indicates   that their diabetes is well controlled and a value  greater than or equal to 7% indicates suboptimal  control. A1c targets should be individualized based on  duration of diabetes, age, comorbid conditions, and  other considerations. . Currently, no consensus exists regarding use of hemoglobin A1c for diagnosis of diabetes for children. .    Mean Plasma Glucose 09/20/2020 157  mg/dL Final   eAG (mmol/L) 09/20/2020 8.7  mmol/L Final   Hepatitis C Ab 09/20/2020 NON-REACTIVE  NON-REACTIVE Final   SIGNAL TO CUT-OFF 09/20/2020 0.02  <1.00 Final   Comment: . HCV antibody was non-reactive. There is no laboratory  evidence of HCV infection. . In most cases, no further action is required. However, if recent HCV exposure is suspected, a test for HCV RNA (test code [phone]) is suggested. . For additional information please refer to http://education.quest[facility].com/faq/FAQ22v1 (This link is being provided for informational/ educational purposes only.) .    Cholesterol 09/20/2020 147  <200 mg/dL Final   HDL 09/20/2020 37 (A) > OR = 40 mg/dL Final   Triglycerides 09/20/2020 155 (A) <150 mg/dL Final   LDL Cholesterol (Calc) 09/20/2020 85  mg/dL (calc) Final   Comment: Reference range: <100 . Desirable range <100 mg/dL for primary prevention;   <70 mg/dL for patients with CHD or diabetic patients  with > or = 2 CHD risk factors. Marland Kitchen LDL-C is now calculated using the Martin-Hopkins  calculation, which is a validated novel method providing  better accuracy than the Friedewald equation in the  estimation of LDL-C.  Cresenciano Genre et al. Annamaria Helling. 8101;751(02): 2061-2068  (http://education.[facility].com/faq/FAQ164)    Total CHOL/HDL Ratio 09/20/2020 4.0  <5.0 (calc) Final   Non-HDL Cholesterol (Calc) 09/20/2020 110  <130 mg/dL (calc) Final   Comment: For patients with diabetes plus 1 major ASCVD risk  factor, treating to a non-HDL-C goal of <100 mg/dL  (LDL-C of <70 mg/dL) is considered  a therapeutic  option.    Creatinine, Urine 09/20/2020 94  20 - 320 mg/dL Final   Microalb, Ur 09/20/2020 0.4  mg/dL Final   Comment: Reference Range Not established    Microalb Creat Ratio 09/20/2020 4  <30 mcg/mg creat Final   Comment: . The ADA defines abnormalities in albumin excretion as follows: Marland Kitchen Albuminuria Category        Result (mcg/mg creatinine) . Normal to Mildly increased   <30 Moderately increased         30-299  Severely increased           > OR = 300 . The ADA recommends that at least two of three specimens collected within a 3-6 month period be abnormal before considering a patient to be within a diagnostic category.    PSA 09/20/2020 0.98  < OR = 4.00 ng/mL Final   Comment: The total PSA value from this assay system is  standardized against the WHO standard. The test  result will be approximately 20% lower when compared  to the equimolar-standardized total PSA (Beckman  Coulter). Comparison of serial PSA results should be  interpreted with this fact in mind. . This test was performed using the Siemens  chemiluminescent method. Values obtained from  different assay methods cannot be used interchangeably. PSA levels, regardless of value, should not be interpreted as absolute evidence of the presence or absence of disease.     Past Medical History:  Diagnosis Date   Diabetes mellitus without complication ([place])    Hypertension    Past Surgical History:  Procedure Laterality Date   INCISION AND DRAINAGE PERIRECTAL ABSCESS N/A 02/24/2016   Procedure: IRRIGATION AND DEBRIDEMENT PERIRECTAL ABSCESS AND ANAL FISTULA;  Surgeon: Jackolyn Confer, MD;  Location: [facility];  Service: General;  Laterality: N/A;   thumb surgery     in high school , hx injury playing football   Current Outpatient Medications on File Prior to Visit  Medication Sig Dispense Refill   ASPIRIN LOW DOSE 81 MG EC tablet TAKE 1 TABLET (81 MG TOTAL) BY MOUTH DAILY. SWALLOW WHOLE. 90 tablet  2   blood glucose meter kit and supplies Dispense based on patient and insurance preference. Use up to four times daily as directed. (FOR ICD-9 250.00, 250.01). 1 each 0   empagliflozin (JARDIANCE) 25 MG TABS tablet TAKE 1 TABLET BY MOUTH DAILY BEFORE BREAKFAST. 90 tablet 3   Lancets (ONETOUCH DELICA PLUS [id number]) MISC USE AS DIRECTED TO TEST BLOOD SUGAR ONCE DAILY 100 each 11   lisinopril (ZESTRIL) 10 MG tablet TAKE 1 TABLET BY MOUTH EVERY DAY 90 tablet 3   metFORMIN (GLUCOPHAGE) 1000 MG tablet Take 1 tablet (1,000 mg total) by mouth 2 (two) times daily with a meal. 180 tablet 3   ONETOUCH ULTRA test strip USE TO TEST BLOOD SUGAR ONCE A DAY 100 strip 12   simvastatin (ZOCOR) 10 MG tablet TAKE 1 TABLET BY MOUTH EVERYDAY AT BEDTIME 90 tablet 1   sitaGLIPtin (JANUVIA) 100 MG tablet Take 1 tablet (100 mg total) by mouth daily. 90 tablet 3   Current Facility-Administered Medications on File Prior to Visit  Medication Dose Route Frequency Provider Last Rate Last Admin   0.9 %  sodium chloride infusion  500 mL Intravenous Continuous Milus Banister, MD       No Known Allergies Social History   Socioeconomic History   Marital status: Married    Spouse name: Not on file   Number of children: Not on file   Years of education: Not on file   Highest education level: Not on file  Occupational History   Not on file  Tobacco Use   Smoking status: Former    Types: Cigars    Quit date: 01/19/2016    Years since quitting: 4.6   Smokeless tobacco: Never  Substance and Sexual Activity   Alcohol use: Yes    Comment: Social   Drug use: No   Sexual activity: Not on file  Other Topics Concern   Not on file  Social History Narrative   Not on file   Social Determinants of Health   Financial Resource Strain: Not on file  Food Insecurity: Not on file  Transportation Needs: Not on file  Physical Activity: Not on file  Stress: Not on file  Social Connections: Not on file  Intimate Partner  Violence: Not on file     Review of Systems  All other systems reviewed and are negative.     Objective:   Physical Exam Vitals reviewed.  Constitutional:      General: He is not in acute distress.  Appearance: Normal appearance. He is well-developed. He is not ill-appearing, toxic-appearing or diaphoretic.  HENT:     Head: Normocephalic and atraumatic.     Right Ear: Tympanic membrane, ear canal and external ear normal. There is no impacted cerumen.     Left Ear: Tympanic membrane, ear canal and external ear normal. There is no impacted cerumen.     Nose: Nose normal. No congestion or rhinorrhea.     Mouth/Throat:     Mouth: Mucous membranes are moist.     Pharynx: Oropharynx is clear. No oropharyngeal exudate or posterior oropharyngeal erythema.  Eyes:     General: No scleral icterus.       Right eye: No discharge.        Left eye: No discharge.     Extraocular Movements: Extraocular movements intact.     Conjunctiva/sclera: Conjunctivae normal.     Pupils: Pupils are equal, round, and reactive to light.  Neck:     Thyroid: No thyromegaly.     Vascular: No carotid bruit or JVD.  Cardiovascular:     Rate and Rhythm: Normal rate and regular rhythm.     Pulses: Normal pulses.     Heart sounds: Normal heart sounds. No murmur heard.   No friction rub. No gallop.  Pulmonary:     Effort: Pulmonary effort is normal. No respiratory distress.     Breath sounds: Normal breath sounds. No stridor. No wheezing, rhonchi or rales.  Chest:     Chest wall: No tenderness.  Abdominal:     General: Bowel sounds are normal. There is no distension.     Palpations: Abdomen is soft. There is no mass.     Tenderness: There is no abdominal tenderness. There is no guarding or rebound.  Musculoskeletal:        General: No swelling, tenderness, deformity or signs of injury.     Cervical back: Neck supple. No rigidity.     Right lower leg: No edema.     Left lower leg: No edema.   Lymphadenopathy:     Cervical: No cervical adenopathy.  Skin:    General: Skin is warm.     Coloration: Skin is not jaundiced or pale.     Findings: No bruising, erythema, lesion or rash.  Neurological:     General: No focal deficit present.     Mental Status: He is alert and oriented to person, place, and time. Mental status is at baseline.     Cranial Nerves: No cranial nerve deficit.     Sensory: No sensory deficit.     Motor: No weakness.     Coordination: Coordination normal.     Gait: Gait normal.     Deep Tendon Reflexes: Reflexes normal.  Psychiatric:        Mood and Affect: Mood normal.        Behavior: Behavior normal.        Thought Content: Thought content normal.        Judgment: Judgment normal.          Assessment & Plan:  Need for immunization against influenza - Plan: Flu Vaccine QUAD 11moIM (Fluarix, Fluzone & Alfiuria Quad PF)  Type 2 diabetes mellitus without complication, without long-term current use of insulin (HCC)  Hyperlipidemia, unspecified hyperlipidemia type  Essential hypertension  Prostate cancer screening  General medical exam Colonoscopy is up-to-date.  PSA is excellent.  He received his flu shot.  Blood pressure is outstanding.  Cholesterol is excellent.  I recommended the new COVID booster.  The only room for improvement as his A1c.  We discussed options including adding Actos versus switching Januvia to Trulicity or Rybelsus.  Patient would like to try increasing his exercise and rechecking a hemoglobin A1c in 3 months.  At that time I would recommend Rybelsus and discontinuing Januvia if still greater than 7

## 2020-10-08 ENCOUNTER — Other Ambulatory Visit: Payer: Self-pay | Admitting: Family Medicine

## 2020-10-08 DIAGNOSIS — E119 Type 2 diabetes mellitus without complications: Secondary | ICD-10-CM

## 2020-10-18 ENCOUNTER — Other Ambulatory Visit: Payer: Self-pay | Admitting: Family Medicine

## 2020-11-01 ENCOUNTER — Other Ambulatory Visit: Payer: Self-pay | Admitting: Family Medicine

## 2020-12-22 ENCOUNTER — Other Ambulatory Visit: Payer: Self-pay

## 2020-12-22 DIAGNOSIS — E119 Type 2 diabetes mellitus without complications: Secondary | ICD-10-CM

## 2020-12-26 ENCOUNTER — Other Ambulatory Visit: Payer: BC Managed Care – PPO

## 2021-01-04 LAB — HM DIABETES EYE EXAM

## 2021-02-08 ENCOUNTER — Other Ambulatory Visit: Payer: Self-pay

## 2021-02-09 ENCOUNTER — Other Ambulatory Visit: Payer: Self-pay | Admitting: Family Medicine

## 2021-02-09 ENCOUNTER — Other Ambulatory Visit: Payer: Self-pay

## 2021-02-09 DIAGNOSIS — L03119 Cellulitis of unspecified part of limb: Secondary | ICD-10-CM | POA: Insufficient documentation

## 2021-02-09 MED ORDER — SITAGLIPTIN PHOSPHATE 100 MG PO TABS: 100.0000 mg | ORAL_TABLET | Freq: Every day | ORAL | 3 refills | Status: DC

## 2021-03-06 ENCOUNTER — Other Ambulatory Visit: Payer: Self-pay | Admitting: Family Medicine

## 2021-03-06 DIAGNOSIS — E119 Type 2 diabetes mellitus without complications: Secondary | ICD-10-CM

## 2021-05-31 ENCOUNTER — Other Ambulatory Visit: Payer: Self-pay | Admitting: Family Medicine

## 2021-06-01 NOTE — Telephone Encounter (Signed)
Requested Prescriptions  Pending Prescriptions Disp Refills  . ONETOUCH ULTRA test strip [employer] Med Name: [place] TEST STRP] 100 strip 12    Sig: USE TO TEST BLOOD SUGAR ONCE A DAY     Endocrinology: Diabetes - Testing Supplies Failed - 05/31/2021  2:07 AM      Failed - Valid encounter within last 12 months    Recent Outpatient Visits          8 months ago Need for immunization against influenza   [place] Pickard, Cammie Mcgee, MD   1 year ago Renal insufficiency   [place] Susy Frizzle, MD   2 years ago Type 2 diabetes mellitus without complication, without long-term current use of insulin ([place])   [place] Susy Frizzle, MD   3 years ago Type 2 diabetes mellitus without complication, without long-term current use of insulin ([place])   [place] Pickard, Cammie Mcgee, MD   3 years ago Essential hypertension   [place] Orlena Sheldon, [place]

## 2021-06-25 ENCOUNTER — Other Ambulatory Visit: Payer: Self-pay | Admitting: Family Medicine

## 2021-06-26 NOTE — Telephone Encounter (Signed)
Requested Prescriptions  Pending Prescriptions Disp Refills  . Lancets (ONETOUCH DELICA PLUS [id number]) MISC [Pharmacy Med Name: ONE TOUCH DELICA PLUS 33G LANC] 100 each 0    Sig: USE AS DIRECTED TO TEST BLOOD SUGAR ONCE DAILY     Endocrinology: Diabetes - Testing Supplies Failed - 06/26/2021  2:00 PM      Failed - Valid encounter within last 12 months    Recent Outpatient Visits          9 months ago Need for immunization against influenza   [facility] Pickard, Priscille [place], MD   1 year ago Renal insufficiency   [facility] Donita Brooks, MD   2 years ago Type 2 diabetes mellitus without complication, without long-term current use of insulin (HCC)   [facility] Donita Brooks, MD   3 years ago Type 2 diabetes mellitus without complication, without long-term current use of insulin (HCC)   [facility] Pickard, Priscille Heidelberg, MD   3 years ago Essential hypertension   [facility] Dorena Bodo, [place]

## 2021-10-03 ENCOUNTER — Other Ambulatory Visit: Payer: Self-pay

## 2021-10-03 DIAGNOSIS — E119 Type 2 diabetes mellitus without complications: Secondary | ICD-10-CM

## 2021-10-03 DIAGNOSIS — I10 Essential (primary) hypertension: Secondary | ICD-10-CM

## 2021-10-03 MED ORDER — LISINOPRIL 10 MG PO TABS: 10.0000 mg | ORAL_TABLET | Freq: Every day | ORAL | 0 refills | Status: DC

## 2021-10-03 NOTE — Telephone Encounter (Signed)
Called, left vm to call back to schedule CPE. Will refill medications. Patient needs OV for additional refills.  Requested Prescriptions  Pending Prescriptions Disp Refills  . lisinopril (ZESTRIL) 10 MG tablet 90 tablet 3    Sig: Take 1 tablet (10 mg total) by mouth daily.     Cardiovascular:  ACE Inhibitors Failed - 10/03/2021  9:17 AM      Failed - Cr in normal range and within 180 days    Creat  Date Value Ref Range Status  09/20/2020 1.37 (H) 0.70 - 1.35 mg/dL Final   Creatinine, Urine  Date Value Ref Range Status  09/20/2020 94 20 - 320 mg/dL Final         Failed - K in normal range and within 180 days    Potassium  Date Value Ref Range Status  09/20/2020 5.0 3.5 - 5.3 mmol/L Final         Failed - Valid encounter within last 6 months    Recent Outpatient Visits          1 year ago Need for immunization against influenza   [place] Susy Frizzle, MD   1 year ago Renal insufficiency   [place] Susy Frizzle, MD   2 years ago Type 2 diabetes mellitus without complication, without long-term current use of insulin ([place])   [place] Susy Frizzle, MD   3 years ago Type 2 diabetes mellitus without complication, without long-term current use of insulin ([place])   [place] Pickard, Cammie Mcgee, MD   4 years ago Essential hypertension   [place], Mary B, [place]             Passed - Patient is not pregnant      Passed - Last BP in normal range    BP Readings from Last 1 Encounters:  09/23/20 128/72

## 2021-10-03 NOTE — Telephone Encounter (Signed)
Pharmacy faxed a refill request for lisinopril (ZESTRIL) 10 MG tablet [[id number]]    Order Details Dose, Route, Frequency: As Directed  Dispense Quantity: 90 tablet Refills: 3        Sig: TAKE 1 TABLET BY MOUTH EVERY DAY       Start Date: 08/04/20 End Date: --  Written Date: 08/04/20 Expiration Date: 08/04/21     Associated Diagnoses: Type 2 diabetes mellitus without complication, without long-term current use of insulin ([place]) [E11.9]; Essential hypertension [I10]  Original Order:  lisinopril (ZESTRIL) 10 MG tablet [[id number]]

## 2021-10-10 ENCOUNTER — Other Ambulatory Visit: Payer: Self-pay | Admitting: Family Medicine

## 2021-10-11 NOTE — Telephone Encounter (Signed)
Requested Prescriptions  Pending Prescriptions Disp Refills  . Lancets (ONETOUCH DELICA PLUS [id number]) [place] [Pharmacy Med Name: ONE TOUCH DELICA PLUS 58N LANC] 277 each 0    Sig: USE AS DIRECTED TO TEST BLOOD SUGAR ONCE DAILY     Endocrinology: Diabetes - Testing Supplies Failed - 10/10/2021  2:12 PM      Failed - Valid encounter within last 12 months    Recent Outpatient Visits          1 year ago Need for immunization against influenza   [place] Pickard, Cammie Mcgee, MD   2 years ago Renal insufficiency   [place] Susy Frizzle, MD   2 years ago Type 2 diabetes mellitus without complication, without long-term current use of insulin ([place])   [place] Susy Frizzle, MD   3 years ago Type 2 diabetes mellitus without complication, without long-term current use of insulin ([place])   [place] Pickard, Cammie Mcgee, MD   4 years ago Essential hypertension   [place] Orlena Sheldon, [place]

## 2021-10-19 ENCOUNTER — Other Ambulatory Visit: Payer: Self-pay | Admitting: Family Medicine

## 2021-10-19 DIAGNOSIS — E119 Type 2 diabetes mellitus without complications: Secondary | ICD-10-CM

## 2021-10-19 NOTE — Telephone Encounter (Signed)
Requested medications are due for refill today.  yes  Requested medications are on the active medications list.  yes  Last refill. 10/11/2020  Future visit scheduled.   no  Notes to clinic.  Labs are expired. Pt needs appt.    Requested Prescriptions  Pending Prescriptions Disp Refills   ASPIRIN LOW DOSE 81 MG tablet [Pharmacy Med Name: ASPIRIN EC 81 MG TABLET] 120 tablet 2    Sig: TAKE 1 TABLET BY MOUTH DAILY. SWALLOW WHOLE.     Analgesics:  NSAIDS - aspirin Failed - 10/19/2021  3:31 AM      Failed - Cr in normal range and within 360 days    Creat  Date Value Ref Range Status  09/20/2020 1.37 (H) 0.70 - 1.35 mg/dL Final   Creatinine, Urine  Date Value Ref Range Status  09/20/2020 94 20 - 320 mg/dL Final         Failed - eGFR is 10 or above and within 360 days    GFR, Est African American  Date Value Ref Range Status  01/15/2020 63 > OR = 60 mL/min/1.59m Final   GFR, Est Non African American  Date Value Ref Range Status  01/15/2020 54 (L) > OR = 60 mL/min/1.752mFinal   eGFR  Date Value Ref Range Status  09/20/2020 58 (L) > OR = 60 mL/min/1.7341minal    Comment:    The eGFR is based on the CKD-EPI 2021 equation. To calculate  the new eGFR from a previous Creatinine or Cystatin C result, go to https://www.kidney.org/professionals/ kdoqi/gfr%5Fcalculator          Failed - Valid encounter within last 12 months    Recent Outpatient Visits           1 year ago Need for immunization against influenza   BroBorupckard, WarCammie McgeeD   2 years ago Renal insufficiency   BroTubaccSusy FrizzleD   2 years ago Type 2 diabetes mellitus without complication, without long-term current use of insulin ([place] Bro[place]Susy FrizzleD   3 years ago Type 2 diabetes mellitus without complication, without long-term current use of insulin ([facility]CC[place] Bro[place]ellieburgckard, WarCammie McgeeD   4  years ago Essential hypertension   [place]           Passed - Patient is not pregnant

## 2021-10-28 ENCOUNTER — Other Ambulatory Visit: Payer: Self-pay | Admitting: Family Medicine

## 2021-11-04 ENCOUNTER — Other Ambulatory Visit: Payer: Self-pay | Admitting: Family Medicine

## 2021-11-06 NOTE — Telephone Encounter (Signed)
Requested medications are due for refill today.  yes  Requested medications are on the active medications list.  yes  Last refill. 11/01/2020 #90 3 rf  Future visit scheduled.   no  Notes to clinic.  Pt is more than 3 months overdue for OV.    Requested Prescriptions  Pending Prescriptions Disp Refills   JARDIANCE 25 MG TABS tablet [Pharmacy Med Name: JARDIANCE 25 MG TABLET] 90 tablet 3    Sig: TAKE 1 TABLET BY MOUTH [place]     Endocrinology:  Diabetes - SGLT2 Inhibitors Failed - 11/04/2021  1:25 PM      Failed - Cr in normal range and within 360 days    Creat  Date Value Ref Range Status  09/20/2020 1.37 (H) 0.70 - 1.35 mg/dL Final   Creatinine, Urine  Date Value Ref Range Status  09/20/2020 94 20 - 320 mg/dL Final         Failed - HBA1C is between 0 and 7.9 and within 180 days    Hgb A1c MFr Bld  Date Value Ref Range Status  09/20/2020 7.1 (H) <5.7 % of total Hgb Final    Comment:    For someone without known diabetes, a hemoglobin A1c value of 6.5% or greater indicates that they may have  diabetes and this should be confirmed with a follow-up  test. . For someone with known diabetes, a value <7% indicates  that their diabetes is well controlled and a value  greater than or equal to 7% indicates suboptimal  control. A1c targets should be individualized based on  duration of diabetes, age, comorbid conditions, and  other considerations. . Currently, no consensus exists regarding use of hemoglobin A1c for diagnosis of diabetes for children. .          Failed - eGFR in normal range and within 360 days    GFR, Est African American  Date Value Ref Range Status  01/15/2020 63 > OR = 60 mL/min/1.74m Final   GFR, Est Non African American  Date Value Ref Range Status  01/15/2020 54 (L) > OR = 60 mL/min/1.779mFinal   eGFR  Date Value Ref Range Status  09/20/2020 58 (L) > OR = 60 mL/min/1.7380minal    Comment:    The eGFR is based on the  CKD-EPI 2021 equation. To calculate  the new eGFR from a previous Creatinine or Cystatin C result, go to https://www.kidney.org/professionals/ kdoqi/gfr%5Fcalculator          Failed - Valid encounter within last 6 months    Recent Outpatient Visits           1 year ago Need for immunization against influenza   BroWoodbineckard, WarCammie McgeeD   2 years ago Renal insufficiency   [place] FrizzleD   2 years ago Type 2 diabetes mellitus without complication, without long-term current use of insulin ([facility]CC[place] BroDermottcSusy FrizzleD   3 years ago Type 2 diabetes mellitus without complication, without long-term current use of insulin ([place]CC[place] BroGrasonvilleckard, WarCammie McgeeD   4 years ago Essential hypertension   Bro[place]uff[place]lena [place]

## 2021-11-07 ENCOUNTER — Other Ambulatory Visit: Payer: Self-pay

## 2021-11-07 DIAGNOSIS — E119 Type 2 diabetes mellitus without complications: Secondary | ICD-10-CM

## 2021-11-07 MED ORDER — EMPAGLIFLOZIN 25 MG PO TABS: ORAL_TABLET | ORAL | 3 refills | Status: DC

## 2021-11-15 ENCOUNTER — Other Ambulatory Visit: Payer: Self-pay | Admitting: Family Medicine

## 2021-11-15 DIAGNOSIS — E119 Type 2 diabetes mellitus without complications: Secondary | ICD-10-CM

## 2021-11-15 NOTE — Telephone Encounter (Signed)
Requested Prescriptions  Pending Prescriptions Disp Refills   [facility] ASPIRIN LOW DOSE 81 MG tablet [Pharmacy Med Name: [facility] ASPIRIN EC 81 MG TABLET] 90 tablet 0    Sig: TAKE 1 TABLET BY MOUTH DAILY. SWALLOW WHOLE.     Analgesics:  NSAIDS - aspirin Failed - 11/15/2021  2:32 PM      Failed - Cr in normal range and within 360 days    Creat  Date Value Ref Range Status  09/20/2020 1.37 (H) 0.70 - 1.35 mg/dL Final   Creatinine, Urine  Date Value Ref Range Status  09/20/2020 94 20 - 320 mg/dL Final         Failed - eGFR is 10 or above and within 360 days    GFR, Est African American  Date Value Ref Range Status  01/15/2020 63 > OR = 60 mL/min/1.31m Final   GFR, Est Non African American  Date Value Ref Range Status  01/15/2020 54 (L) > OR = 60 mL/min/1.77mFinal   eGFR  Date Value Ref Range Status  09/20/2020 58 (L) > OR = 60 mL/min/1.7320minal    Comment:    The eGFR is based on the CKD-EPI 2021 equation. To calculate  the new eGFR from a previous Creatinine or Cystatin C result, go to https://www.kidney.org/professionals/ kdoqi/gfr%5Fcalculator          Failed - Valid encounter within last 12 months    Recent Outpatient Visits           1 year ago Need for immunization against influenza   Bro[place]ckard, WarCammie McgeeD   2 years ago Renal insufficiency   BroPanolacSusy FrizzleD   2 years ago Type 2 diabetes mellitus without complication, without long-term current use of insulin (HCCRupert BroHarrisburgcSusy FrizzleD   3 years ago Type 2 diabetes mellitus without complication, without long-term current use of insulin (HCCD[place] BroSunriseckard, WarCammie McgeeD   4 years ago Essential hypertension   Bro[place]ristownar[place]uc[place]A-[place]       Future Appointments             In 1 month Pickard, WarCammie McgeeD BroMemphisECHonoluluPatient is  not pregnant

## 2021-12-18 ENCOUNTER — Other Ambulatory Visit: Payer: BC Managed Care – PPO

## 2021-12-18 DIAGNOSIS — I10 Essential (primary) hypertension: Secondary | ICD-10-CM

## 2021-12-18 DIAGNOSIS — E119 Type 2 diabetes mellitus without complications: Secondary | ICD-10-CM | POA: Diagnosis not present

## 2021-12-18 DIAGNOSIS — Z125 Encounter for screening for malignant neoplasm of prostate: Secondary | ICD-10-CM | POA: Diagnosis not present

## 2021-12-18 DIAGNOSIS — R739 Hyperglycemia, unspecified: Secondary | ICD-10-CM | POA: Diagnosis not present

## 2021-12-18 DIAGNOSIS — E785 Hyperlipidemia, unspecified: Secondary | ICD-10-CM

## 2021-12-21 LAB — COMPLETE METABOLIC PANEL WITH GFR
AG Ratio: 1.7 (calc) (ref 1.0–2.5)
ALT: 17 U/L (ref 9–46)
AST: 17 U/L (ref 10–35)
Albumin: 4.7 g/dL (ref 3.6–5.1)
Alkaline phosphatase (APISO): 45 U/L (ref 35–144)
BUN: 17 mg/dL (ref 7–25)
CO2: 27 mmol/L (ref 20–32)
Calcium: 9.3 mg/dL (ref 8.6–10.3)
Chloride: 104 mmol/L (ref 98–110)
Creat: 1.03 mg/dL (ref 0.70–1.35)
Globulin: 2.7 g/dL (calc) (ref 1.9–3.7)
Glucose, Bld: 142 mg/dL — ABNORMAL HIGH (ref 65–99)
Potassium: 4.2 mmol/L (ref 3.5–5.3)
Sodium: 139 mmol/L (ref 135–146)
Total Bilirubin: 0.3 mg/dL (ref 0.2–1.2)
Total Protein: 7.4 g/dL (ref 6.1–8.1)
eGFR: 81 mL/min/{1.73_m2} (ref >=60)

## 2021-12-21 LAB — HEMOGLOBIN A1C
Hgb A1c MFr Bld: 8.3 % of total Hgb — ABNORMAL HIGH (ref <5.7)
Mean Plasma Glucose: 192 mg/dL
eAG (mmol/L): 10.6 mmol/L

## 2021-12-21 LAB — CBC WITH DIFFERENTIAL/PLATELET
Absolute Monocytes: 381 cells/uL (ref 200–950)
Basophils Absolute: 29 cells/uL (ref 0–200)
Basophils Relative: 0.9 %
Eosinophils Absolute: 29 cells/uL (ref 15–500)
Eosinophils Relative: 0.9 %
HCT: 43.3 % (ref 38.5–50.0)
Hemoglobin: 13.8 g/dL (ref 13.2–17.1)
Lymphs Abs: 1814 cells/uL (ref 850–3900)
MCH: 23.4 pg — ABNORMAL LOW (ref 27.0–33.0)
MCHC: 31.9 g/dL — ABNORMAL LOW (ref 32.0–36.0)
MCV: 73.3 fL — ABNORMAL LOW (ref 80.0–100.0)
MPV: 11.4 fL (ref 7.5–12.5)
Monocytes Relative: 11.9 %
Neutro Abs: 947 cells/uL — ABNORMAL LOW (ref 1500–7800)
Neutrophils Relative %: 29.6 %
Platelets: 188 10*3/uL (ref 140–400)
RBC: 5.91 10*6/uL — ABNORMAL HIGH (ref 4.20–5.80)
RDW: 17 % — ABNORMAL HIGH (ref 11.0–15.0)
Total Lymphocyte: 56.7 %
WBC: 3.2 10*3/uL — ABNORMAL LOW (ref 3.8–10.8)

## 2021-12-21 LAB — LIPID PANEL
Cholesterol: 143 mg/dL (ref <200)
HDL: 38 mg/dL — ABNORMAL LOW (ref >=40)
LDL Cholesterol (Calc): 81 mg/dL (calc)
Non-HDL Cholesterol (Calc): 105 mg/dL (calc) (ref <130)
Total CHOL/HDL Ratio: 3.8 (calc) (ref <5.0)
Triglycerides: 144 mg/dL (ref <150)

## 2021-12-21 LAB — TEST AUTHORIZATION

## 2021-12-21 LAB — VITAMIN B12: Vitamin B-12: 335 pg/mL (ref 200–1100)

## 2021-12-21 LAB — MICROALBUMIN / CREATININE URINE RATIO
Creatinine, Urine: 104 mg/dL (ref 20–320)
Microalb Creat Ratio: 10 mcg/mg creat (ref <30)
Microalb, Ur: 1 mg/dL

## 2021-12-21 LAB — PSA: PSA: 1.52 ng/mL (ref <=4.00)

## 2021-12-26 ENCOUNTER — Ambulatory Visit (INDEPENDENT_AMBULATORY_CARE_PROVIDER_SITE_OTHER): Payer: BC Managed Care – PPO | Admitting: Family Medicine

## 2021-12-26 ENCOUNTER — Encounter: Payer: Self-pay | Admitting: Family Medicine

## 2021-12-26 VITALS — BP 126/64 | HR 65 | Ht 69.0 in | Wt 194.6 lb

## 2021-12-26 DIAGNOSIS — Z125 Encounter for screening for malignant neoplasm of prostate: Secondary | ICD-10-CM | POA: Diagnosis not present

## 2021-12-26 DIAGNOSIS — I10 Essential (primary) hypertension: Secondary | ICD-10-CM

## 2021-12-26 DIAGNOSIS — E119 Type 2 diabetes mellitus without complications: Secondary | ICD-10-CM

## 2021-12-26 DIAGNOSIS — Z0000 Encounter for general adult medical examination without abnormal findings: Secondary | ICD-10-CM | POA: Diagnosis not present

## 2021-12-26 DIAGNOSIS — E785 Hyperlipidemia, unspecified: Secondary | ICD-10-CM | POA: Diagnosis not present

## 2021-12-26 MED ORDER — TIRZEPATIDE 5 MG/0.5ML ~~LOC~~ SOAJ: 5.0000 mg | SUBCUTANEOUS | 1 refills | Status: DC

## 2021-12-26 NOTE — Progress Notes (Signed)
Subjective:    Patient ID: Paul Nichols, male    DOB: Jul 21, 1955, 66 y.o.   MRN: [id number]  HPI  Patient is a very pleasant 66 year old African-American gentleman who is here today for complete physical exam.  He denies any concerns.  His lab work is listed below and shows an increase in his A1c to 8.3.  This occurred after his insurance forced him to stop Januvia.  Otherwise he is doing well.  He denies any chest pain or shortness of breath or dyspnea on exertion.  He has had his flu shot.  Has had his shingles vaccine.  He is due for his COVID booster.  He gets his eyes checked once a year by an ophthalmologist.  His diabetic foot exam was performed today and is normal.  Immunization History  Administered Date(s) Administered   Influenza Inj Mdck Quad Pf 11/18/2017, 10/27/2018   Influenza,inj,Quad PF,6+ Mos 12/03/2016, 10/08/2019, 09/23/2020   PFIZER(Purple Top)SARS-COV-2 Vaccination 03/29/2019, 04/29/2019   Pneumococcal Polysaccharide-23 08/27/2016   Tdap 07/09/2014   Zoster Recombinat (Shingrix) 05/28/2016, 09/07/2016   Lab on 12/18/2021  Component Date Value Ref Range Status   WBC 12/18/2021 3.2 (L)  3.8 - 10.8 Thousand/uL Final   RBC 12/18/2021 5.91 (H)  4.20 - 5.80 Million/uL Final   Hemoglobin 12/18/2021 13.8  13.2 - 17.1 g/dL Final   HCT 12/18/2021 43.3  38.5 - 50.0 % Final   MCV 12/18/2021 73.3 (L)  80.0 - 100.0 fL Final   MCH 12/18/2021 23.4 (L)  27.0 - 33.0 pg Final   MCHC 12/18/2021 31.9 (L)  32.0 - 36.0 g/dL Final   RDW 12/18/2021 17.0 (H)  11.0 - 15.0 % Final   Platelets 12/18/2021 188  140 - 400 Thousand/uL Final   MPV 12/18/2021 11.4  7.5 - 12.5 fL Final   Neutro Abs 12/18/2021 947 (L)  1,500 - 7,800 cells/uL Final   Lymphs Abs 12/18/2021 1,814  850 - 3,900 cells/uL Final   Absolute Monocytes 12/18/2021 381  200 - 950 cells/uL Final   Eosinophils Absolute 12/18/2021 29  15 - 500 cells/uL Final   Basophils Absolute 12/18/2021 29  0 - 200 cells/uL Final    Neutrophils Relative % 12/18/2021 29.6  % Final   Total Lymphocyte 12/18/2021 56.7  % Final   Monocytes Relative 12/18/2021 11.9  % Final   Eosinophils Relative 12/18/2021 0.9  % Final   Basophils Relative 12/18/2021 0.9  % Final   Glucose, Bld 12/18/2021 142 (H)  65 - 99 mg/dL Final   Comment: .            Fasting reference interval . For someone without known diabetes, a glucose value >125 mg/dL indicates that they may have diabetes and this should be confirmed with a follow-up test. .    BUN 12/18/2021 17  7 - 25 mg/dL Final   Creat 12/18/2021 1.03  0.70 - 1.35 mg/dL Final   eGFR 12/18/2021 81  > OR = 60 mL/min/1.29m Final   BUN/Creatinine Ratio 12/18/2021 SEE NOTE:  6 - 22 (calc) Final   Comment:    Not Reported: BUN and Creatinine are within    reference range. .    Sodium 12/18/2021 139  135 - 146 mmol/L Final   Potassium 12/18/2021 4.2  3.5 - 5.3 mmol/L Final   Chloride 12/18/2021 104  98 - 110 mmol/L Final   CO2 12/18/2021 27  20 - 32 mmol/L Final   Calcium 12/18/2021 9.3  8.6 - 10.3 mg/dL Final  Total Protein 12/18/2021 7.4  6.1 - 8.1 g/dL Final   Albumin 12/18/2021 4.7  3.6 - 5.1 g/dL Final   Globulin 12/18/2021 2.7  1.9 - 3.7 g/dL (calc) Final   AG Ratio 12/18/2021 1.7  1.0 - 2.5 (calc) Final   Total Bilirubin 12/18/2021 0.3  0.2 - 1.2 mg/dL Final   Alkaline phosphatase (APISO) 12/18/2021 45  35 - 144 U/L Final   AST 12/18/2021 17  10 - 35 U/L Final   ALT 12/18/2021 17  9 - 46 U/L Final   Cholesterol 12/18/2021 143  <200 mg/dL Final   HDL 12/18/2021 38 (L)  > OR = 40 mg/dL Final   Triglycerides 12/18/2021 144  <150 mg/dL Final   LDL Cholesterol (Calc) 12/18/2021 81  mg/dL (calc) Final   Comment: Reference range: <100 . Desirable range <100 mg/dL for primary prevention;   <70 mg/dL for patients with CHD or diabetic patients  with > or = 2 CHD risk factors. Marland Kitchen LDL-C is now calculated using the Martin-Hopkins  calculation, which is a validated novel method  providing  better accuracy than the Friedewald equation in the  estimation of LDL-C.  Cresenciano Genre et al. Annamaria Helling. 1660;630(16): 2061-2068  (http://education.[facility].com/faq/FAQ164)    Total CHOL/HDL Ratio 12/18/2021 3.8  <5.0 (calc) Final   Non-HDL Cholesterol (Calc) 12/18/2021 105  <130 mg/dL (calc) Final   Comment: For patients with diabetes plus 1 major ASCVD risk  factor, treating to a non-HDL-C goal of <100 mg/dL  (LDL-C of <70 mg/dL) is considered a therapeutic  option.    Creatinine, Urine 12/18/2021 104  20 - 320 mg/dL Final   Microalb, Ur 12/18/2021 1.0  mg/dL Final   Comment: Reference Range Not established    Microalb Creat Ratio 12/18/2021 10  <30 mcg/mg creat Final   Comment: . The ADA defines abnormalities in albumin excretion as follows: Marland Kitchen Albuminuria Category        Result (mcg/mg creatinine) . Normal to Mildly increased   <30 Moderately increased         30-299  Severely increased           > OR = 300 . The ADA recommends that at least two of three specimens collected within a 3-6 month period be abnormal before considering a patient to be within a diagnostic category.    Vitamin B-12 12/18/2021 335  200 - 1,100 pg/mL Final   Comment: . Please Note: Although the reference range for vitamin B12 is [phone] pg/mL, it has been reported that between 5 and 10% of patients with values between 200 and 400 pg/mL may experience neuropsychiatric and hematologic abnormalities due to occult B12 deficiency; less than 1% of patients with values above 400 pg/mL will have symptoms. Marland Kitchen    PSA 12/18/2021 1.52  < OR = 4.00 ng/mL Final   Comment: The total PSA value from this assay system is  standardized against the WHO standard. The test  result will be approximately 20% lower when compared  to the equimolar-standardized total PSA (Beckman  Coulter). Comparison of serial PSA results should be  interpreted with this fact in mind. . This test was performed using  the Siemens  chemiluminescent method. Values obtained from  different assay methods cannot be used interchangeably. PSA levels, regardless of value, should not be interpreted as absolute evidence of the presence or absence of disease.    Hgb A1c MFr Bld 12/18/2021 8.3 (H)  <5.7 % of total Hgb Final   Comment: For someone  without known diabetes, a hemoglobin A1c value of 6.5% or greater indicates that they may have  diabetes and this should be confirmed with a follow-up  test. . For someone with known diabetes, a value <7% indicates  that their diabetes is well controlled and a value  greater than or equal to 7% indicates suboptimal  control. A1c targets should be individualized based on  duration of diabetes, age, comorbid conditions, and  other considerations. . Currently, no consensus exists regarding use of hemoglobin A1c for diagnosis of diabetes for children. .    Mean Plasma Glucose 12/18/2021 192  mg/dL Final   eAG (mmol/L) 12/18/2021 10.6  mmol/L Final   TEST NAME: 12/18/2021 HEMOGLOBIN A1c WITH eAG   Final   TEST CODE: 12/18/2021 [id number]   Final   CLIENT CONTACT: 12/18/2021 Quay Burow   Final   REPORT ALWAYS MESSAGE SIGNATURE 12/18/2021    Final   Comment: . The laboratory testing on this patient was verbally requested or confirmed by the ordering physician or his or her authorized representative after contact with an employee of [employer]. Federal regulations require that we maintain on file written authorization for all laboratory testing.  Accordingly we are asking that the ordering physician or his or her authorized representative sign a copy of this report and promptly return it to the client service representative. . . Signature:____________________________________________________ . Please fax this signed page to [phone] or return it via your [employer] courier.     Past Medical History:  Diagnosis Date   Diabetes mellitus without  complication ([place])    Hypertension    Past Surgical History:  Procedure Laterality Date   INCISION AND DRAINAGE PERIRECTAL ABSCESS N/A 02/24/2016   Procedure: IRRIGATION AND DEBRIDEMENT PERIRECTAL ABSCESS AND ANAL FISTULA;  Surgeon: Jackolyn Confer, MD;  Location: [facility];  Service: General;  Laterality: N/A;   thumb surgery     in high school , hx injury playing football   Current Outpatient Medications on File Prior to Visit  Medication Sig Dispense Refill   aspirin EC ([facility] ASPIRIN LOW DOSE) 81 MG tablet TAKE 1 TABLET BY MOUTH DAILY. SWALLOW WHOLE. 90 tablet 0   blood glucose meter kit and supplies Dispense based on patient and insurance preference. Use up to four times daily as directed. (FOR ICD-9 250.00, 250.01). 1 each 0   JANUVIA 100 MG tablet TAKE 1 TABLET BY MOUTH EVERY DAY 90 tablet 3   Lancets (ONETOUCH DELICA PLUS [id number]) MISC USE AS DIRECTED TO TEST BLOOD SUGAR ONCE DAILY 100 each 0   lisinopril (ZESTRIL) 10 MG tablet Take 1 tablet (10 mg total) by mouth daily. 90 tablet 0   metFORMIN (GLUCOPHAGE) 1000 MG tablet TAKE 1 TABLET BY MOUTH TWICE A DAY WITH A MEAL 180 tablet 3   ONETOUCH ULTRA test strip USE TO TEST BLOOD SUGAR ONCE A DAY 100 strip 12   simvastatin (ZOCOR) 10 MG tablet TAKE 1 TABLET BY MOUTH EVERYDAY AT BEDTIME 90 tablet 3   Current Facility-Administered Medications on File Prior to Visit  Medication Dose Route Frequency Provider Last Rate Last Admin   0.9 %  sodium chloride infusion  500 mL Intravenous Continuous Milus Banister, MD       No Known Allergies Social History   Socioeconomic History   Marital status: Married    Spouse name: Not on file   Number of children: Not on file   Years of education: Not on file   Highest education level: Not  on file  Occupational History   Not on file  Tobacco Use   Smoking status: Former    Types: Cigars    Quit date: 01/19/2016    Years since quitting: 5.9   Smokeless tobacco: Never  Substance and Sexual  Activity   Alcohol use: Yes    Comment: Social   Drug use: No   Sexual activity: Not on file  Other Topics Concern   Not on file  Social History Narrative   Not on file   Social Determinants of Health   Financial Resource Strain: Not on file  Food Insecurity: Not on file  Transportation Needs: Not on file  Physical Activity: Not on file  Stress: Not on file  Social Connections: Not on file  Intimate Partner Violence: Not on file     Review of Systems  All other systems reviewed and are negative.      Objective:   Physical Exam Vitals reviewed.  Constitutional:      General: He is not in acute distress.    Appearance: Normal appearance. He is well-developed. He is not ill-appearing, toxic-appearing or diaphoretic.  HENT:     Head: Normocephalic and atraumatic.     Right Ear: Tympanic membrane, ear canal and external ear normal. There is no impacted cerumen.     Left Ear: Tympanic membrane, ear canal and external ear normal. There is no impacted cerumen.     Nose: Nose normal. No congestion or rhinorrhea.     Mouth/Throat:     Mouth: Mucous membranes are moist.     Pharynx: Oropharynx is clear. No oropharyngeal exudate or posterior oropharyngeal erythema.  Eyes:     General: No scleral icterus.       Right eye: No discharge.        Left eye: No discharge.     Extraocular Movements: Extraocular movements intact.     Conjunctiva/sclera: Conjunctivae normal.     Pupils: Pupils are equal, round, and reactive to light.  Neck:     Thyroid: No thyromegaly.     Vascular: No carotid bruit or JVD.  Cardiovascular:     Rate and Rhythm: Normal rate and regular rhythm.     Pulses: Normal pulses.     Heart sounds: Normal heart sounds. No murmur heard.    No friction rub. No gallop.  Pulmonary:     Effort: Pulmonary effort is normal. No respiratory distress.     Breath sounds: Normal breath sounds. No stridor. No wheezing, rhonchi or rales.  Chest:     Chest wall: No  tenderness.  Abdominal:     General: Bowel sounds are normal. There is no distension.     Palpations: Abdomen is soft. There is no mass.     Tenderness: There is no abdominal tenderness. There is no guarding or rebound.  Musculoskeletal:        General: No swelling, tenderness, deformity or signs of injury.     Cervical back: Neck supple. No rigidity.     Right lower leg: No edema.     Left lower leg: No edema.  Lymphadenopathy:     Cervical: No cervical adenopathy.  Skin:    General: Skin is warm.     Coloration: Skin is not jaundiced or pale.     Findings: No bruising, erythema, lesion or rash.  Neurological:     General: No focal deficit present.     Mental Status: He is alert and oriented to person, place, and time.  Mental status is at baseline.     Cranial Nerves: No cranial nerve deficit.     Sensory: No sensory deficit.     Motor: No weakness.     Coordination: Coordination normal.     Gait: Gait normal.     Deep Tendon Reflexes: Reflexes normal.  Psychiatric:        Mood and Affect: Mood normal.        Behavior: Behavior normal.        Thought Content: Thought content normal.        Judgment: Judgment normal.           Assessment & Plan:  Type 2 diabetes mellitus without complication, without long-term current use of insulin (HCC)  Hyperlipidemia, unspecified hyperlipidemia type  Prostate cancer screening  General medical exam  Essential hypertension Blood pressure today is outstanding.  We will add Mounjaro 5 mg subcu weekly and uptitrate monthly as tolerated to 12.5 mg to achieve a hemoglobin A1c hopefully below 6.5.  Patient will also work on diet and exercise.  Diabetic eye exam and foot exam are up-to-date and are normal.  Cholesterol is good except for low HDL cholesterol.  Recommended aerobic exercise.  PSA is excellent.  Colonoscopy is up-to-date.  Recheck in 3 months

## 2022-01-03 ENCOUNTER — Other Ambulatory Visit: Payer: Self-pay | Admitting: Family Medicine

## 2022-01-03 DIAGNOSIS — E119 Type 2 diabetes mellitus without complications: Secondary | ICD-10-CM

## 2022-01-03 DIAGNOSIS — I10 Essential (primary) hypertension: Secondary | ICD-10-CM

## 2022-01-07 ENCOUNTER — Other Ambulatory Visit: Payer: Self-pay | Admitting: Family Medicine

## 2022-02-16 ENCOUNTER — Other Ambulatory Visit: Payer: Self-pay | Admitting: Family Medicine

## 2022-02-16 DIAGNOSIS — E119 Type 2 diabetes mellitus without complications: Secondary | ICD-10-CM

## 2022-02-16 NOTE — Telephone Encounter (Signed)
Requested Prescriptions  Pending Prescriptions Disp Refills   ASPIRIN LOW DOSE 81 MG tablet [Pharmacy Med Name: ASPIRIN EC 81 MG TABLET] 90 tablet 0    Sig: TAKE 1 TABLET BY MOUTH DAILY. SWALLOW WHOLE.     Analgesics:  NSAIDS - aspirin Failed - 02/16/2022  4:30 AM      Failed - Valid encounter within last 12 months    Recent Outpatient Visits           1 year ago Need for immunization against influenza   [place] Pickard, Cammie Mcgee, MD   2 years ago Renal insufficiency   [place] Susy Frizzle, MD   3 years ago Type 2 diabetes mellitus without complication, without long-term current use of insulin ([place])   [place] Susy Frizzle, MD   4 years ago Type 2 diabetes mellitus without complication, without long-term current use of insulin ([place])   [place] Pickard, Cammie Mcgee, MD   4 years ago Essential hypertension   [place], [place], PA-C       Future Appointments             In 2 months Pickard, Cammie Mcgee, MD [facility]            Passed - Cr in normal range and within 360 days    Creat  Date Value Ref Range Status  12/18/2021 1.03 0.70 - 1.35 mg/dL Final   Creatinine, Urine  Date Value Ref Range Status  12/18/2021 104 20 - 320 mg/dL Final         Passed - eGFR is 10 or above and within 360 days    GFR, Est African American  Date Value Ref Range Status  01/15/2020 63 > OR = 60 mL/min/1.76m Final   GFR, Est Non African American  Date Value Ref Range Status  01/15/2020 54 (L) > OR = 60 mL/min/1.720mFinal   eGFR  Date Value Ref Range Status  12/18/2021 81 > OR = 60 mL/min/1.7333minal         Passed - Patient is not pregnant

## 2022-03-05 ENCOUNTER — Other Ambulatory Visit: Payer: Self-pay | Admitting: Family Medicine

## 2022-03-05 DIAGNOSIS — E119 Type 2 diabetes mellitus without complications: Secondary | ICD-10-CM

## 2022-03-31 ENCOUNTER — Other Ambulatory Visit: Payer: Self-pay | Admitting: Family Medicine

## 2022-03-31 DIAGNOSIS — E119 Type 2 diabetes mellitus without complications: Secondary | ICD-10-CM

## 2022-03-31 DIAGNOSIS — I10 Essential (primary) hypertension: Secondary | ICD-10-CM

## 2022-04-02 NOTE — Telephone Encounter (Signed)
Requested Prescriptions  Pending Prescriptions Disp Refills   lisinopril (ZESTRIL) 10 MG tablet [Pharmacy Med Name: LISINOPRIL 10 MG TABLET] 90 tablet 0    Sig: TAKE 1 TABLET BY MOUTH EVERY DAY     Cardiovascular:  ACE Inhibitors Failed - 03/31/2022  8:43 AM      Failed - Valid encounter within last 6 months    Recent Outpatient Visits           1 year ago Need for immunization against influenza   [place] Pickard, Cammie Mcgee, MD   2 years ago Renal insufficiency   [place] Susy Frizzle, MD   3 years ago Type 2 diabetes mellitus without complication, without long-term current use of insulin ([place])   [place] Susy Frizzle, MD   4 years ago Type 2 diabetes mellitus without complication, without long-term current use of insulin ([place])   [place] Pickard, Cammie Mcgee, MD   4 years ago Essential hypertension   [place], [place], PA-C       Future Appointments             In 3 weeks Dennard Schaumann, Cammie Mcgee, MD [facility]            Passed - Cr in normal range and within 180 days    Creat  Date Value Ref Range Status  12/18/2021 1.03 0.70 - 1.35 mg/dL Final   Creatinine, Urine  Date Value Ref Range Status  12/18/2021 104 20 - 320 mg/dL Final         Passed - K in normal range and within 180 days    Potassium  Date Value Ref Range Status  12/18/2021 4.2 3.5 - 5.3 mmol/L Final         Passed - Patient is not pregnant      Passed - Last BP in normal range    BP Readings from Last 1 Encounters:  12/26/21 126/64

## 2022-04-10 ENCOUNTER — Other Ambulatory Visit: Payer: Self-pay | Admitting: Family Medicine

## 2022-04-16 ENCOUNTER — Other Ambulatory Visit: Payer: BC Managed Care – PPO

## 2022-04-16 DIAGNOSIS — E119 Type 2 diabetes mellitus without complications: Secondary | ICD-10-CM | POA: Diagnosis not present

## 2022-04-16 DIAGNOSIS — I10 Essential (primary) hypertension: Secondary | ICD-10-CM | POA: Diagnosis not present

## 2022-04-16 DIAGNOSIS — E538 Deficiency of other specified B group vitamins: Secondary | ICD-10-CM | POA: Diagnosis not present

## 2022-04-16 DIAGNOSIS — E785 Hyperlipidemia, unspecified: Secondary | ICD-10-CM

## 2022-04-16 DIAGNOSIS — Z125 Encounter for screening for malignant neoplasm of prostate: Secondary | ICD-10-CM

## 2022-04-17 LAB — COMPLETE METABOLIC PANEL WITH GFR
AG Ratio: 1.9 (calc) (ref 1.0–2.5)
AST: 17 U/L (ref 10–35)
Alkaline phosphatase (APISO): 47 U/L (ref 35–144)
CO2: 25 mmol/L (ref 20–32)
Calcium: 9.6 mg/dL (ref 8.6–10.3)
Chloride: 103 mmol/L (ref 98–110)
Globulin: 2.5 g/dL (calc) (ref 1.9–3.7)
Sodium: 138 mmol/L (ref 135–146)
Total Protein: 7.2 g/dL (ref 6.1–8.1)

## 2022-04-17 LAB — VITAMIN B12: Vitamin B-12: 288 pg/mL (ref 200–1100)

## 2022-04-17 LAB — LIPID PANEL
Cholesterol: 148 mg/dL (ref <200)
Triglycerides: 145 mg/dL (ref <150)

## 2022-04-17 LAB — MICROALBUMIN / CREATININE URINE RATIO
Creatinine, Urine: 118 mg/dL (ref 20–320)
Microalb Creat Ratio: 3 mg/g creat (ref <30)

## 2022-04-17 LAB — PSA: PSA: 1.33 ng/mL (ref <=4.00)

## 2022-04-18 LAB — COMPLETE METABOLIC PANEL WITH GFR
ALT: 19 U/L (ref 9–46)
Albumin: 4.7 g/dL (ref 3.6–5.1)
BUN: 22 mg/dL (ref 7–25)
Creat: 1.26 mg/dL (ref 0.70–1.35)
Glucose, Bld: 105 mg/dL — ABNORMAL HIGH (ref 65–99)
Potassium: 4.5 mmol/L (ref 3.5–5.3)
Total Bilirubin: 0.3 mg/dL (ref 0.2–1.2)
eGFR: 64 mL/min/{1.73_m2} (ref >=60)

## 2022-04-18 LAB — HEMOGLOBIN A1C
Hgb A1c MFr Bld: 7.2 % of total Hgb — ABNORMAL HIGH (ref <5.7)
Mean Plasma Glucose: 160 mg/dL
eAG (mmol/L): 8.9 mmol/L

## 2022-04-18 LAB — CBC WITH DIFFERENTIAL/PLATELET
Absolute Monocytes: 480 cells/uL (ref 200–950)
Basophils Absolute: 31 cells/uL (ref 0–200)
Basophils Relative: 0.8 %
Eosinophils Absolute: 39 cells/uL (ref 15–500)
Eosinophils Relative: 1 %
HCT: 45.2 % (ref 38.5–50.0)
Hemoglobin: 13.9 g/dL (ref 13.2–17.1)
Lymphs Abs: 1931 cells/uL (ref 850–3900)
MCH: 23.1 pg — ABNORMAL LOW (ref 27.0–33.0)
MCHC: 30.8 g/dL — ABNORMAL LOW (ref 32.0–36.0)
MCV: 75.1 fL — ABNORMAL LOW (ref 80.0–100.0)
MPV: 11.5 fL (ref 7.5–12.5)
Monocytes Relative: 12.3 %
Neutro Abs: 1420 cells/uL — ABNORMAL LOW (ref 1500–7800)
Neutrophils Relative %: 36.4 %
Platelets: 237 10*3/uL (ref 140–400)
RBC: 6.02 10*6/uL — ABNORMAL HIGH (ref 4.20–5.80)
RDW: 16.6 % — ABNORMAL HIGH (ref 11.0–15.0)
Total Lymphocyte: 49.5 %
WBC: 3.9 10*3/uL (ref 3.8–10.8)

## 2022-04-18 LAB — LIPID PANEL
HDL: 40 mg/dL (ref >=40)
LDL Cholesterol (Calc): 83 mg/dL (calc)
Non-HDL Cholesterol (Calc): 108 mg/dL (calc) (ref <130)
Total CHOL/HDL Ratio: 3.7 (calc) (ref <5.0)

## 2022-04-18 LAB — MICROALBUMIN / CREATININE URINE RATIO: Microalb, Ur: 0.3 mg/dL

## 2022-04-19 ENCOUNTER — Other Ambulatory Visit: Payer: Self-pay

## 2022-04-19 MED ORDER — SIMVASTATIN 10 MG PO TABS: ORAL_TABLET | ORAL | 0 refills | Status: DC

## 2022-04-19 NOTE — Telephone Encounter (Signed)
Requested Prescriptions  Pending Prescriptions Disp Refills   simvastatin (ZOCOR) 10 MG tablet 90 tablet 0    Sig: TAKE 1 TABLET BY MOUTH EVERYDAY AT BEDTIME     Cardiovascular:  Antilipid - Statins Failed - 04/19/2022  9:35 AM      Failed - Valid encounter within last 12 months    Recent Outpatient Visits           1 year ago Need for immunization against influenza   [facility] Pickard, Priscille Heidelberg, MD   2 years ago Renal insufficiency   [facility] Donita Brooks, MD   3 years ago Type 2 diabetes mellitus without complication, without long-term current use of insulin (HCC)   [facility] Donita Brooks, MD   4 years ago Type 2 diabetes mellitus without complication, without long-term current use of insulin (HCC)   [facility] Pickard, Priscille Heidelberg, MD   4 years ago Essential hypertension   [facility] Dorena Bodo, PA-C       Future Appointments             In 1 week Tanya Nones, Priscille Heidelberg, MD [facility]            Failed - Lipid Panel in normal range within the last 12 months    Cholesterol  Date Value Ref Range Status  04/16/2022 148 <200 mg/dL Final   LDL Cholesterol (Calc)  Date Value Ref Range Status  04/16/2022 83 mg/dL (calc) Final    Comment:    Reference range: <100 . Desirable range <100 mg/dL for primary prevention;   <70 mg/dL for patients with CHD or diabetic patients  with > or = 2 CHD risk factors. Marland Kitchen LDL-C is now calculated using the Martin-Hopkins  calculation, which is a validated novel method providing  better accuracy than the Friedewald equation in the  estimation of LDL-C.  Horald Pollen et al. Lenox Ahr. 2993;716(96): 2061-2068  (http://education.[facility].com/faq/FAQ164)    HDL  Date Value Ref Range Status  04/16/2022 40 > OR = 40 mg/dL Final   Triglycerides  Date Value Ref Range Status  04/16/2022 145 <150 mg/dL Final          Passed - Patient is not pregnant

## 2022-04-19 NOTE — Telephone Encounter (Signed)
Prescription Request  04/19/2022  LOV: 04/16/22  What is the name of the medication or equipment? simvastatin (ZOCOR) 10 MG tablet [[id number]]  Have you contacted your pharmacy to request a refill? Yes   Which pharmacy would you like this sent to?  [id number] - [place], [place] - [address] [place] [address] Phone: [phone] Fax: [phone]    Patient notified that their request is being sent to the clinical staff for review and that they should receive a response within 2 business days.   Please advise at Home [phone]

## 2022-04-27 ENCOUNTER — Encounter: Payer: Self-pay | Admitting: Family Medicine

## 2022-04-27 ENCOUNTER — Ambulatory Visit (INDEPENDENT_AMBULATORY_CARE_PROVIDER_SITE_OTHER): Payer: BC Managed Care – PPO | Admitting: Family Medicine

## 2022-04-27 VITALS — BP 116/64 | HR 73 | Temp 98.1°F | Ht 69.0 in | Wt 195.0 lb

## 2022-04-27 DIAGNOSIS — I10 Essential (primary) hypertension: Secondary | ICD-10-CM

## 2022-04-27 DIAGNOSIS — E119 Type 2 diabetes mellitus without complications: Secondary | ICD-10-CM | POA: Diagnosis not present

## 2022-04-27 MED ORDER — TIRZEPATIDE 7.5 MG/0.5ML ~~LOC~~ SOAJ: 7.5000 mg | SUBCUTANEOUS | 11 refills | Status: DC

## 2022-04-27 NOTE — Progress Notes (Signed)
Subjective:    Patient ID: Paul Nichols, male    DOB: 1955-10-07, 67 y.o.   MRN: [id number]  HPI 12/23 Patient is a very pleasant 67 year old African-American gentleman who is here today for complete physical exam.  He denies any concerns.  His lab work is listed below and shows an increase in his A1c to 8.3.  This occurred after his insurance forced him to stop Januvia.  Otherwise he is doing well.  He denies any chest pain or shortness of breath or dyspnea on exertion.  He has had his flu shot.  Has had his shingles vaccine.  He is due for his COVID booster.  He gets his eyes checked once a year by an ophthalmologist.  His diabetic foot exam was performed today and is normal.  At that time, my plan was: Blood pressure today is outstanding.  We will add Mounjaro 5 mg subcu weekly and uptitrate monthly as tolerated to 12.5 mg to achieve a hemoglobin A1c hopefully below 6.5.  Patient will also work on diet and exercise.  Diabetic eye exam and foot exam are up-to-date and are normal.  Cholesterol is good except for low HDL cholesterol.  Recommended aerobic exercise.  PSA is excellent.  Colonoscopy is up-to-date.  Recheck in 3 months  04/26/21 Patient is currently on 5 mg of Mounjaro.  He denies any nausea or vomiting.  He seems to be tolerating the medication well without side effects.  His A1c has fallen from 8.3-7.2.  He has been on the GLP-1 agonist for approximately 4 months.  He denies any chest pain, shortness of breath, dyspnea on exertion.  He denies any neuropathy in his feet.  His diabetic foot exam was performed today and is excellent.  He has palpable dorsalis pedis and posterior tibialis pulses bilaterally with no evidence of any ulcers or wounds on the plantar aspects of his feet and no evidence of any nerve damage. Lab on 04/16/2022  Component Date Value Ref Range Status   WBC 04/16/2022 3.9  3.8 - 10.8 Thousand/uL Final   RBC 04/16/2022 6.02 (H)  4.20 - 5.80 Million/uL Final    Hemoglobin 04/16/2022 13.9  13.2 - 17.1 g/dL Final   HCT 40/98/1191 45.2  38.5 - 50.0 % Final   MCV 04/16/2022 75.1 (L)  80.0 - 100.0 fL Final   MCH 04/16/2022 23.1 (L)  27.0 - 33.0 pg Final   MCHC 04/16/2022 30.8 (L)  32.0 - 36.0 g/dL Final   RDW 47/82/9562 16.6 (H)  11.0 - 15.0 % Final   Platelets 04/16/2022 237  140 - 400 Thousand/uL Final   MPV 04/16/2022 11.5  7.5 - 12.5 fL Final   Neutro Abs 04/16/2022 1,420 (L)  1,500 - 7,800 cells/uL Final   Lymphs Abs 04/16/2022 1,931  850 - 3,900 cells/uL Final   Absolute Monocytes 04/16/2022 480  200 - 950 cells/uL Final   Eosinophils Absolute 04/16/2022 39  15 - 500 cells/uL Final   Basophils Absolute 04/16/2022 31  0 - 200 cells/uL Final   Neutrophils Relative % 04/16/2022 36.4  % Final   Total Lymphocyte 04/16/2022 49.5  % Final   Monocytes Relative 04/16/2022 12.3  % Final   Eosinophils Relative 04/16/2022 1.0  % Final   Basophils Relative 04/16/2022 0.8  % Final   Glucose, Bld 04/16/2022 105 (H)  65 - 99 mg/dL Final   Comment: .            Fasting reference interval . For someone without  known diabetes, a glucose value between 100 and 125 mg/dL is consistent with prediabetes and should be confirmed with a follow-up test. .    BUN 04/16/2022 22  7 - 25 mg/dL Final   Creat 16/10/9602 1.26  0.70 - 1.35 mg/dL Final   eGFR 54/09/8117 64  > OR = 60 mL/min/1.84m2 Final   BUN/Creatinine Ratio 04/16/2022 SEE NOTE:  6 - 22 (calc) Final   Comment:    Not Reported: BUN and Creatinine are within    reference range. .    Sodium 04/16/2022 138  135 - 146 mmol/L Final   Potassium 04/16/2022 4.5  3.5 - 5.3 mmol/L Final   Chloride 04/16/2022 103  98 - 110 mmol/L Final   CO2 04/16/2022 25  20 - 32 mmol/L Final   Calcium 04/16/2022 9.6  8.6 - 10.3 mg/dL Final   Total Protein 14/78/2956 7.2  6.1 - 8.1 g/dL Final   Albumin 21/30/8657 4.7  3.6 - 5.1 g/dL Final   Globulin 84/69/6295 2.5  1.9 - 3.7 g/dL (calc) Final   AG Ratio 04/16/2022 1.9  1.0 -  2.5 (calc) Final   Total Bilirubin 04/16/2022 0.3  0.2 - 1.2 mg/dL Final   Alkaline phosphatase (APISO) 04/16/2022 47  35 - 144 U/L Final   AST 04/16/2022 17  10 - 35 U/L Final   ALT 04/16/2022 19  9 - 46 U/L Final   Hgb A1c MFr Bld 04/16/2022 7.2 (H)  <5.7 % of total Hgb Final   Comment: For someone without known diabetes, a hemoglobin A1c value of 6.5% or greater indicates that they may have  diabetes and this should be confirmed with a follow-up  test. . For someone with known diabetes, a value <7% indicates  that their diabetes is well controlled and a value  greater than or equal to 7% indicates suboptimal  control. A1c targets should be individualized based on  duration of diabetes, age, comorbid conditions, and  other considerations. . Currently, no consensus exists regarding use of hemoglobin A1c for diagnosis of diabetes for children. .    Mean Plasma Glucose 04/16/2022 160  mg/dL Final   eAG (mmol/L) 28/41/3244 8.9  mmol/L Final   Comment: . This test was performed on the Roche cobas c503 platform. Effective 10/16/21, a change in test platforms from the Abbott Architect to the Roche cobas c503 may have shifted HbA1c results compared to historical results. Based on laboratory validation testing conducted at [facility], the Roche platform relative to the Abbott platform had an average increase in HbA1c value of < or = 0.3%. This difference is within accepted  variability established by the [facility]. Note that not all individuals will have had a shift in their results and direct comparisons between historical and current results for testing conducted on different platforms is not recommended.    Cholesterol 04/16/2022 148  <200 mg/dL Final   HDL 01/10/7251 40  > OR = 40 mg/dL Final   Triglycerides 66/44/0347 145  <150 mg/dL Final   LDL Cholesterol (Calc) 04/16/2022 83  mg/dL (calc) Final   Comment: Reference range:  <100 . Desirable range <100 mg/dL for primary prevention;   <70 mg/dL for patients with CHD or diabetic patients  with > or = 2 CHD risk factors. Marland Kitchen LDL-C is now calculated using the Martin-Hopkins  calculation, which is a validated novel method providing  better accuracy than the Friedewald equation in the  estimation of LDL-C.  Horald Pollen et al. Lenox Ahr.  1610;960(45): 2061-2068  (http://education.[facility].com/faq/FAQ164)    Total CHOL/HDL Ratio 04/16/2022 3.7  <4.0 (calc) Final   Non-HDL Cholesterol (Calc) 04/16/2022 108  <130 mg/dL (calc) Final   Comment: For patients with diabetes plus 1 major ASCVD risk  factor, treating to a non-HDL-C goal of <100 mg/dL  (LDL-C of <98 mg/dL) is considered a therapeutic  option.    Creatinine, Urine 04/16/2022 118  20 - 320 mg/dL Final   Microalb, Ur 11/91/4782 0.3  mg/dL Final   Comment: Reference Range Not established    Microalb Creat Ratio 04/16/2022 3  <30 mg/g creat Final   Comment: . The ADA defines abnormalities in albumin excretion as follows: Marland Kitchen Albuminuria Category        Result (mg/g creatinine) . Normal to Mildly increased   <30 Moderately increased         30-299  Severely increased           > OR = 300 . The ADA recommends that at least two of three specimens collected within a 3-6 month period be abnormal before considering a patient to be within a diagnostic category.    Vitamin B-12 04/16/2022 288  200 - 1,100 pg/mL Final   Comment: . Please Note: Although the reference range for vitamin B12 is [phone] pg/mL, it has been reported that between 5 and 10% of patients with values between 200 and 400 pg/mL may experience neuropsychiatric and hematologic abnormalities due to occult B12 deficiency; less than 1% of patients with values above 400 pg/mL will have symptoms. Marland Kitchen    PSA 04/16/2022 1.33  < OR = 4.00 ng/mL Final   Comment: The total PSA value from this assay system is  standardized against the WHO  standard. The test  result will be approximately 20% lower when compared  to the equimolar-standardized total PSA (Beckman  Coulter). Comparison of serial PSA results should be  interpreted with this fact in mind. . This test was performed using the Siemens  chemiluminescent method. Values obtained from  different assay methods cannot be used interchangeably. PSA levels, regardless of value, should not be interpreted as absolute evidence of the presence or absence of disease.     Past Medical History:  Diagnosis Date   Diabetes mellitus without complication    Hypertension    Past Surgical History:  Procedure Laterality Date   INCISION AND DRAINAGE PERIRECTAL ABSCESS N/A 02/24/2016   Procedure: IRRIGATION AND DEBRIDEMENT PERIRECTAL ABSCESS AND ANAL FISTULA;  Surgeon: Avel Peace, MD;  Location: [facility];  Service: General;  Laterality: N/A;   thumb surgery     in high school , hx injury playing football   Current Outpatient Medications on File Prior to Visit  Medication Sig Dispense Refill   ASPIRIN LOW DOSE 81 MG tablet TAKE 1 TABLET BY MOUTH DAILY. SWALLOW WHOLE. 90 tablet 0   blood glucose meter kit and supplies Dispense based on patient and insurance preference. Use up to four times daily as directed. (FOR ICD-9 250.00, 250.01). 1 each 0   empagliflozin (JARDIANCE) 25 MG TABS tablet Take by mouth daily.     Lancets (ONETOUCH DELICA PLUS LANCET33G) MISC USE AS DIRECTED TO TEST BLOOD SUGAR ONCE DAILY 100 each 0   lisinopril (ZESTRIL) 10 MG tablet TAKE 1 TABLET BY MOUTH EVERY DAY 90 tablet 0   metFORMIN (GLUCOPHAGE) 1000 MG tablet TAKE 1 TABLET BY MOUTH TWICE A DAY WITH MEALS 180 tablet 3   ONETOUCH ULTRA test strip USE TO TEST BLOOD SUGAR  ONCE A DAY 100 strip 12   simvastatin (ZOCOR) 10 MG tablet TAKE 1 TABLET BY MOUTH EVERYDAY AT BEDTIME 90 tablet 0   tirzepatide (MOUNJARO) 5 MG/0.5ML Pen Inject 5 mg into the skin once a week. 6 mL 1   No current facility-administered  medications on file prior to visit.   No Known Allergies Social History   Socioeconomic History   Marital status: Married    Spouse name: Not on file   Number of children: Not on file   Years of education: Not on file   Highest education level: Bachelor's degree (e.g., BA, AB, BS)  Occupational History   Not on file  Tobacco Use   Smoking status: Former    Types: Cigars    Quit date: 01/19/2016    Years since quitting: 6.2   Smokeless tobacco: Never  Substance and Sexual Activity   Alcohol use: Yes    Comment: Social   Drug use: No   Sexual activity: Not on file  Other Topics Concern   Not on file  Social History Narrative   Not on file   Social Determinants of Health   Financial Resource Strain: Low Risk  (04/26/2022)   Overall Financial Resource Strain (CARDIA)    Difficulty of Paying Living Expenses: Not hard at all  Food Insecurity: No Food Insecurity (04/26/2022)   Hunger Vital Sign    Worried About Running Out of Food in the Last Year: Never true    Ran Out of Food in the Last Year: Never true  Transportation Needs: No Transportation Needs (04/26/2022)   PRAPARE - Administrator, Civil Service (Medical): No    Lack of Transportation (Non-Medical): No  Physical Activity: Insufficiently Active (04/26/2022)   Exercise Vital Sign    Days of Exercise per Week: 3 days    Minutes of Exercise per Session: 40 min  Stress: No Stress Concern Present (04/26/2022)   [employer] of Occupational Health - Occupational Stress Questionnaire    Feeling of Stress : Not at all  Social Connections: Unknown (04/26/2022)   Social Connection and Isolation Panel [NHANES]    Frequency of Communication with Friends and Family: Once a week    Frequency of Social Gatherings with Friends and Family: Twice a week    Attends Religious Services: Patient declined    Database administrator or Organizations: No    Attends Engineer, structural: Not on file    Marital  Status: Married  Catering manager Violence: Not on file     Review of Systems  All other systems reviewed and are negative.      Objective:   Physical Exam Vitals reviewed.  Constitutional:      General: He is not in acute distress.    Appearance: Normal appearance. He is well-developed. He is not ill-appearing, toxic-appearing or diaphoretic.  HENT:     Head: Normocephalic and atraumatic.     Right Ear: Tympanic membrane, ear canal and external ear normal. There is no impacted cerumen.     Left Ear: Tympanic membrane, ear canal and external ear normal. There is no impacted cerumen.     Nose: Nose normal. No congestion or rhinorrhea.     Mouth/Throat:     Mouth: Mucous membranes are moist.     Pharynx: Oropharynx is clear. No oropharyngeal exudate or posterior oropharyngeal erythema.  Eyes:     General: No scleral icterus.       Right eye: No discharge.  Left eye: No discharge.     Extraocular Movements: Extraocular movements intact.     Conjunctiva/sclera: Conjunctivae normal.     Pupils: Pupils are equal, round, and reactive to light.  Neck:     Thyroid: No thyromegaly.     Vascular: No carotid bruit or JVD.  Cardiovascular:     Rate and Rhythm: Normal rate and regular rhythm.     Pulses: Normal pulses.     Heart sounds: Normal heart sounds. No murmur heard.    No friction rub. No gallop.  Pulmonary:     Effort: Pulmonary effort is normal. No respiratory distress.     Breath sounds: Normal breath sounds. No stridor. No wheezing, rhonchi or rales.  Chest:     Chest wall: No tenderness.  Abdominal:     General: Bowel sounds are normal. There is no distension.     Palpations: Abdomen is soft. There is no mass.     Tenderness: There is no abdominal tenderness. There is no guarding or rebound.  Musculoskeletal:        General: No swelling, tenderness, deformity or signs of injury.     Cervical back: Neck supple. No rigidity.     Right lower leg: No edema.      Left lower leg: No edema.  Lymphadenopathy:     Cervical: No cervical adenopathy.  Skin:    General: Skin is warm.     Coloration: Skin is not jaundiced or pale.     Findings: No bruising, erythema, lesion or rash.  Neurological:     General: No focal deficit present.     Mental Status: He is alert and oriented to person, place, and time. Mental status is at baseline.     Cranial Nerves: No cranial nerve deficit.     Sensory: No sensory deficit.     Motor: No weakness.     Coordination: Coordination normal.     Gait: Gait normal.     Deep Tendon Reflexes: Reflexes normal.  Psychiatric:        Mood and Affect: Mood normal.        Behavior: Behavior normal.        Thought Content: Thought content normal.        Judgment: Judgment normal.           Assessment & Plan:  Type 2 diabetes mellitus without complication, without long-term current use of insulin  Essential hypertension Patient's blood pressure today is outstanding.  We did agree to increase Mounjaro to 7.5 mg subcu weekly and then recheck lab work in 6 months.  My only concern today is there is been a slight rise in his creatinine and his GFR has fallen from 80-64.  I believe that this could be dehydration so I did ask the gentleman to drink more water.  Repeat this in 6 months.  Recheck sooner if any symptoms arise.

## 2022-05-15 ENCOUNTER — Other Ambulatory Visit: Payer: Self-pay | Admitting: Family Medicine

## 2022-05-15 DIAGNOSIS — E119 Type 2 diabetes mellitus without complications: Secondary | ICD-10-CM

## 2022-06-03 ENCOUNTER — Other Ambulatory Visit: Payer: Self-pay | Admitting: Family Medicine

## 2022-06-05 NOTE — Telephone Encounter (Signed)
Requested Prescriptions  Pending Prescriptions Disp Refills   ONETOUCH ULTRA test strip [Pharmacy Med Name: ONE TOUCH ULTRA BLUE TEST STRP] 100 strip 3    Sig: USE TO TEST BLOOD SUGAR ONCE A DAY     Endocrinology: Diabetes - Testing Supplies Failed - 06/03/2022  9:01 AM      Failed - Valid encounter within last 12 months    Recent Outpatient Visits           1 year ago Need for immunization against influenza   [facility] Pickard, Priscille Heidelberg, MD   2 years ago Renal insufficiency   [facility] Donita Brooks, MD   3 years ago Type 2 diabetes mellitus without complication, without long-term current use of insulin (HCC)   [facility] Donita Brooks, MD   4 years ago Type 2 diabetes mellitus without complication, without long-term current use of insulin (HCC)   [facility] Pickard, Priscille Heidelberg, MD   4 years ago Essential hypertension   [facility] Dorena Bodo, PA-C       Future Appointments             In 4 months Pickard, Priscille Heidelberg, MD [facility]

## 2022-06-11 ENCOUNTER — Other Ambulatory Visit: Payer: Self-pay | Admitting: Family Medicine

## 2022-06-28 ENCOUNTER — Other Ambulatory Visit: Payer: Self-pay | Admitting: Family Medicine

## 2022-06-28 DIAGNOSIS — E119 Type 2 diabetes mellitus without complications: Secondary | ICD-10-CM

## 2022-06-28 DIAGNOSIS — I10 Essential (primary) hypertension: Secondary | ICD-10-CM

## 2022-06-28 NOTE — Telephone Encounter (Signed)
Requested Prescriptions  Pending Prescriptions Disp Refills   lisinopril (ZESTRIL) 10 MG tablet [Pharmacy Med Name: LISINOPRIL 10 MG TABLET] 90 tablet 1    Sig: TAKE 1 TABLET BY MOUTH EVERY DAY     Cardiovascular:  ACE Inhibitors Failed - 06/28/2022  2:39 AM      Failed - Valid encounter within last 6 months    Recent Outpatient Visits           1 year ago Need for immunization against influenza   [facility] Pickard, Priscille Heidelberg, MD   2 years ago Renal insufficiency   [facility] Donita Brooks, MD   3 years ago Type 2 diabetes mellitus without complication, without long-term current use of insulin (HCC)   [facility] Donita Brooks, MD   4 years ago Type 2 diabetes mellitus without complication, without long-term current use of insulin (HCC)   [facility] Pickard, Priscille Heidelberg, MD   4 years ago Essential hypertension   [facility] Dorena Bodo, PA-C       Future Appointments             In 4 months Pickard, Priscille Heidelberg, MD [facility]            Passed - Cr in normal range and within 180 days    Creat  Date Value Ref Range Status  04/16/2022 1.26 0.70 - 1.35 mg/dL Final   Creatinine, Urine  Date Value Ref Range Status  04/16/2022 118 20 - 320 mg/dL Final         Passed - K in normal range and within 180 days    Potassium  Date Value Ref Range Status  04/16/2022 4.5 3.5 - 5.3 mmol/L Final         Passed - Patient is not pregnant      Passed - Last BP in normal range    BP Readings from Last 1 Encounters:  04/27/22 116/64

## 2022-07-11 ENCOUNTER — Other Ambulatory Visit: Payer: Self-pay | Admitting: Family Medicine

## 2022-07-21 ENCOUNTER — Other Ambulatory Visit: Payer: Self-pay | Admitting: Family Medicine

## 2022-07-23 NOTE — Telephone Encounter (Signed)
Last OV 12/26/21 Requested Prescriptions  Pending Prescriptions Disp Refills   simvastatin (ZOCOR) 10 MG tablet [Pharmacy Med Name: SIMVASTATIN 10 MG TABLET] 90 tablet 0    Sig: TAKE 1 TABLET BY MOUTH EVERYDAY AT BEDTIME     Cardiovascular:  Antilipid - Statins Failed - 07/21/2022  9:24 AM      Failed - Valid encounter within last 12 months    Recent Outpatient Visits           1 year ago Need for immunization against influenza   [facility] Donita Brooks, MD   2 years ago Renal insufficiency   [facility] Donita Brooks, MD   3 years ago Type 2 diabetes mellitus without complication, without long-term current use of insulin (HCC)   [facility] Donita Brooks, MD   4 years ago Type 2 diabetes mellitus without complication, without long-term current use of insulin (HCC)   [facility] Pickard, Priscille Heidelberg, MD   4 years ago Essential hypertension   [facility] Dorena Bodo, PA-C       Future Appointments             In 3 months Pickard, Priscille Heidelberg, MD [facility]            Failed - Lipid Panel in normal range within the last 12 months    Cholesterol  Date Value Ref Range Status  04/16/2022 148 <200 mg/dL Final   LDL Cholesterol (Calc)  Date Value Ref Range Status  04/16/2022 83 mg/dL (calc) Final    Comment:    Reference range: <100 . Desirable range <100 mg/dL for primary prevention;   <70 mg/dL for patients with CHD or diabetic patients  with > or = 2 CHD risk factors. Marland Kitchen LDL-C is now calculated using the Martin-Hopkins  calculation, which is a validated novel method providing  better accuracy than the Friedewald equation in the  estimation of LDL-C.  Horald Pollen et al. Lenox Ahr. 1610;960(45): 2061-2068  (http://education.[facility].com/faq/FAQ164)    HDL  Date Value Ref Range Status  04/16/2022 40 > OR = 40 mg/dL Final   Triglycerides   Date Value Ref Range Status  04/16/2022 145 <150 mg/dL Final         Passed - Patient is not pregnant

## 2022-08-14 ENCOUNTER — Other Ambulatory Visit: Payer: Self-pay | Admitting: Family Medicine

## 2022-08-14 DIAGNOSIS — E119 Type 2 diabetes mellitus without complications: Secondary | ICD-10-CM

## 2022-08-14 NOTE — Telephone Encounter (Signed)
Last OV 04/27/22 Requested Prescriptions  Pending Prescriptions Disp Refills   aspirin EC 81 MG tablet [Pharmacy Med Name: [facility] ASPIRIN EC 81 MG TABLET] 90 tablet 0    Sig: TAKE 1 TABLET BY MOUTH DAILY. SWALLOW WHOLE.     Analgesics:  NSAIDS - aspirin Failed - 08/14/2022  2:38 AM      Failed - Valid encounter within last 12 months    Recent Outpatient Visits           1 year ago Need for immunization against influenza   [facility] Pickard, Priscille Heidelberg, MD   2 years ago Renal insufficiency   [facility] Donita Brooks, MD   3 years ago Type 2 diabetes mellitus without complication, without long-term current use of insulin (HCC)   [facility] Donita Brooks, MD   4 years ago Type 2 diabetes mellitus without complication, without long-term current use of insulin (HCC)   [facility] Pickard, Priscille Heidelberg, MD   4 years ago Essential hypertension   [facility] Dorena Bodo, PA-C       Future Appointments             In 2 months Pickard, Priscille Heidelberg, MD [facility]            Passed - Cr in normal range and within 360 days    Creat  Date Value Ref Range Status  04/16/2022 1.26 0.70 - 1.35 mg/dL Final   Creatinine, Urine  Date Value Ref Range Status  04/16/2022 118 20 - 320 mg/dL Final         Passed - eGFR is 10 or above and within 360 days    GFR, Est African American  Date Value Ref Range Status  01/15/2020 63 > OR = 60 mL/min/1.29m2 Final   GFR, Est Non African American  Date Value Ref Range Status  01/15/2020 54 (L) > OR = 60 mL/min/1.34m2 Final   eGFR  Date Value Ref Range Status  04/16/2022 64 > OR = 60 mL/min/1.25m2 Final         Passed - Patient is not pregnant

## 2022-10-14 ENCOUNTER — Other Ambulatory Visit: Payer: Self-pay | Admitting: Family Medicine

## 2022-10-22 ENCOUNTER — Other Ambulatory Visit: Payer: BC Managed Care – PPO

## 2022-10-22 DIAGNOSIS — I10 Essential (primary) hypertension: Secondary | ICD-10-CM | POA: Diagnosis not present

## 2022-10-22 DIAGNOSIS — Z125 Encounter for screening for malignant neoplasm of prostate: Secondary | ICD-10-CM | POA: Diagnosis not present

## 2022-10-22 DIAGNOSIS — E785 Hyperlipidemia, unspecified: Secondary | ICD-10-CM

## 2022-10-22 DIAGNOSIS — E119 Type 2 diabetes mellitus without complications: Secondary | ICD-10-CM

## 2022-10-23 LAB — CBC WITH DIFFERENTIAL/PLATELET
Absolute Monocytes: 547 {cells}/uL (ref 200–950)
Basophils Absolute: 30 {cells}/uL (ref 0–200)
Basophils Relative: 0.8 %
Eosinophils Absolute: 42 {cells}/uL (ref 15–500)
Eosinophils Relative: 1.1 %
HCT: 44.9 % (ref 38.5–50.0)
Hemoglobin: 13.7 g/dL (ref 13.2–17.1)
Lymphs Abs: 1889 {cells}/uL (ref 850–3900)
MCH: 23 pg — ABNORMAL LOW (ref 27.0–33.0)
MCHC: 30.5 g/dL — ABNORMAL LOW (ref 32.0–36.0)
MCV: 75.5 fL — ABNORMAL LOW (ref 80.0–100.0)
MPV: 10.7 fL (ref 7.5–12.5)
Monocytes Relative: 14.4 %
Neutro Abs: 1292 {cells}/uL — ABNORMAL LOW (ref 1500–7800)
Neutrophils Relative %: 34 %
Platelets: 215 10*3/uL (ref 140–400)
RBC: 5.95 10*6/uL — ABNORMAL HIGH (ref 4.20–5.80)
RDW: 16 % — ABNORMAL HIGH (ref 11.0–15.0)
Total Lymphocyte: 49.7 %
WBC: 3.8 10*3/uL (ref 3.8–10.8)

## 2022-10-23 LAB — COMPLETE METABOLIC PANEL WITH GFR
AG Ratio: 1.7 (calc) (ref 1.0–2.5)
ALT: 18 U/L (ref 9–46)
AST: 17 U/L (ref 10–35)
Albumin: 4.6 g/dL (ref 3.6–5.1)
Alkaline phosphatase (APISO): 54 U/L (ref 35–144)
BUN: 23 mg/dL (ref 7–25)
CO2: 22 mmol/L (ref 20–32)
Calcium: 9.5 mg/dL (ref 8.6–10.3)
Chloride: 101 mmol/L (ref 98–110)
Creat: 1.29 mg/dL (ref 0.70–1.35)
Globulin: 2.7 g/dL (ref 1.9–3.7)
Glucose, Bld: 104 mg/dL — ABNORMAL HIGH (ref 65–99)
Potassium: 4.5 mmol/L (ref 3.5–5.3)
Sodium: 136 mmol/L (ref 135–146)
Total Bilirubin: 0.3 mg/dL (ref 0.2–1.2)
Total Protein: 7.3 g/dL (ref 6.1–8.1)
eGFR: 62 mL/min/{1.73_m2} (ref >=60)

## 2022-10-23 LAB — LIPID PANEL
Cholesterol: 133 mg/dL (ref <200)
HDL: 36 mg/dL — ABNORMAL LOW (ref >=40)
LDL Cholesterol (Calc): 67 mg/dL
Non-HDL Cholesterol (Calc): 97 mg/dL (ref <130)
Total CHOL/HDL Ratio: 3.7 (calc) (ref <5.0)
Triglycerides: 244 mg/dL — ABNORMAL HIGH (ref <150)

## 2022-10-23 LAB — PSA: PSA: 1.57 ng/mL (ref <=4.00)

## 2022-10-23 LAB — HEMOGLOBIN A1C
Hgb A1c MFr Bld: 7.2 %{Hb} — ABNORMAL HIGH (ref <5.7)
Mean Plasma Glucose: 160 mg/dL
eAG (mmol/L): 8.9 mmol/L

## 2022-10-26 ENCOUNTER — Encounter: Payer: Self-pay | Admitting: Family Medicine

## 2022-10-26 ENCOUNTER — Ambulatory Visit: Payer: BC Managed Care – PPO | Admitting: Family Medicine

## 2022-10-26 VITALS — BP 112/70 | HR 75 | Temp 97.7°F | Ht 69.0 in | Wt 192.0 lb

## 2022-10-26 DIAGNOSIS — E785 Hyperlipidemia, unspecified: Secondary | ICD-10-CM | POA: Diagnosis not present

## 2022-10-26 DIAGNOSIS — Z23 Encounter for immunization: Secondary | ICD-10-CM | POA: Diagnosis not present

## 2022-10-26 DIAGNOSIS — E119 Type 2 diabetes mellitus without complications: Secondary | ICD-10-CM

## 2022-10-26 DIAGNOSIS — I10 Essential (primary) hypertension: Secondary | ICD-10-CM | POA: Diagnosis not present

## 2022-10-26 DIAGNOSIS — Z7985 Long-term (current) use of injectable non-insulin antidiabetic drugs: Secondary | ICD-10-CM

## 2022-10-26 MED ORDER — TIRZEPATIDE 10 MG/0.5ML ~~LOC~~ SOAJ: 10.0000 mg | SUBCUTANEOUS | 5 refills | Status: DC

## 2022-10-26 NOTE — Progress Notes (Signed)
Subjective:    Patient ID: Paul Nichols, male    DOB: February 09, 1955, 67 y.o.   MRN: [id number]  HPI Patient has tried diet and exercise along with his Greggory Keen and London Pepper however his hemoglobin A1c has not dropped.  It is still 7.2.  He denies any chest pain shortness of breath or dyspnea on exertion.  His most recent lab work is listed below.  He is due for his flu shot today.  He denies any in his feet.  He denies any polyuria polydipsia or blurry vision. Lab on 10/22/2022  Component Date Value Ref Range Status   WBC 10/22/2022 3.8  3.8 - 10.8 Thousand/uL Final   RBC 10/22/2022 5.95 (H)  4.20 - 5.80 Million/uL Final   Hemoglobin 10/22/2022 13.7  13.2 - 17.1 g/dL Final   HCT 78/29/5621 44.9  38.5 - 50.0 % Final   MCV 10/22/2022 75.5 (L)  80.0 - 100.0 fL Final   MCH 10/22/2022 23.0 (L)  27.0 - 33.0 pg Final   MCHC 10/22/2022 30.5 (L)  32.0 - 36.0 g/dL Final   Comment: For adults, a slight decrease in the calculated MCHC value (in the range of 30 to 32 g/dL) is most likely not clinically significant; however, it should be interpreted with caution in correlation with other red cell parameters and the patient's clinical condition.    RDW 10/22/2022 16.0 (H)  11.0 - 15.0 % Final   Platelets 10/22/2022 215  140 - 400 Thousand/uL Final   MPV 10/22/2022 10.7  7.5 - 12.5 fL Final   Neutro Abs 10/22/2022 1,292 (L)  1,500 - 7,800 cells/uL Final   Lymphs Abs 10/22/2022 1,889  850 - 3,900 cells/uL Final   Absolute Monocytes 10/22/2022 547  200 - 950 cells/uL Final   Eosinophils Absolute 10/22/2022 42  15 - 500 cells/uL Final   Basophils Absolute 10/22/2022 30  0 - 200 cells/uL Final   Neutrophils Relative % 10/22/2022 34  % Final   Total Lymphocyte 10/22/2022 49.7  % Final   Monocytes Relative 10/22/2022 14.4  % Final   Eosinophils Relative 10/22/2022 1.1  % Final   Basophils Relative 10/22/2022 0.8  % Final   Glucose, Bld 10/22/2022 104 (H)  65 - 99 mg/dL Final   Comment: .             Fasting reference interval . For someone without known diabetes, a glucose value between 100 and 125 mg/dL is consistent with prediabetes and should be confirmed with a follow-up test. .    BUN 10/22/2022 23  7 - 25 mg/dL Final   Creat 30/86/5784 1.29  0.70 - 1.35 mg/dL Final   eGFR 69/62/9528 62  > OR = 60 mL/min/1.82m2 Final   BUN/Creatinine Ratio 10/22/2022 SEE NOTE:  6 - 22 (calc) Final   Comment:    Not Reported: BUN and Creatinine are within    reference range. .    Sodium 10/22/2022 136  135 - 146 mmol/L Final   Potassium 10/22/2022 4.5  3.5 - 5.3 mmol/L Final   Chloride 10/22/2022 101  98 - 110 mmol/L Final   CO2 10/22/2022 22  20 - 32 mmol/L Final   Calcium 10/22/2022 9.5  8.6 - 10.3 mg/dL Final   Total Protein 41/32/4401 7.3  6.1 - 8.1 g/dL Final   Albumin 02/72/5366 4.6  3.6 - 5.1 g/dL Final   Globulin 44/03/4740 2.7  1.9 - 3.7 g/dL (calc) Final   AG Ratio 10/22/2022 1.7  1.0 - 2.5 (  calc) Final   Total Bilirubin 10/22/2022 0.3  0.2 - 1.2 mg/dL Final   Alkaline phosphatase (APISO) 10/22/2022 54  35 - 144 U/L Final   AST 10/22/2022 17  10 - 35 U/L Final   ALT 10/22/2022 18  9 - 46 U/L Final   Hgb A1c MFr Bld 10/22/2022 7.2 (H)  <5.7 % of total Hgb Final   Comment: For someone without known diabetes, a hemoglobin A1c value of 6.5% or greater indicates that they may have  diabetes and this should be confirmed with a follow-up  test. . For someone with known diabetes, a value <7% indicates  that their diabetes is well controlled and a value  greater than or equal to 7% indicates suboptimal  control. A1c targets should be individualized based on  duration of diabetes, age, comorbid conditions, and  other considerations. . Currently, no consensus exists regarding use of hemoglobin A1c for diagnosis of diabetes for children. .    Mean Plasma Glucose 10/22/2022 160  mg/dL Final   eAG (mmol/L) 46/96/2952 8.9  mmol/L Final   Cholesterol 10/22/2022 133  <200 mg/dL Final    HDL 84/13/2440 36 (L)  > OR = 40 mg/dL Final   Triglycerides 11/04/2534 244 (H)  <150 mg/dL Final   Comment: . If a non-fasting specimen was collected, consider repeat triglyceride testing on a fasting specimen if clinically indicated.  Perry Mount et al. J. of Clin. Lipidol. 2015;9:129-169. Marland Kitchen    LDL Cholesterol (Calc) 10/22/2022 67  mg/dL (calc) Final   Comment: Reference range: <100 . Desirable range <100 mg/dL for primary prevention;   <70 mg/dL for patients with CHD or diabetic patients  with > or = 2 CHD risk factors. Marland Kitchen LDL-C is now calculated using the Martin-Hopkins  calculation, which is a validated novel method providing  better accuracy than the Friedewald equation in the  estimation of LDL-C.  Horald Pollen et al. Lenox Ahr. 6440;347(42): 2061-2068  (http://education.Quest[employer].com/faq/FAQ164)    Total CHOL/HDL Ratio 10/22/2022 3.7  <5.9 (calc) Final   Non-HDL Cholesterol (Calc) 10/22/2022 97  <130 mg/dL (calc) Final   Comment: For patients with diabetes plus 1 major ASCVD risk  factor, treating to a non-HDL-C goal of <100 mg/dL  (LDL-C of <56 mg/dL) is considered a therapeutic  option.    PSA 10/22/2022 1.57  < OR = 4.00 ng/mL Final   Comment: The total PSA value from this assay system is  standardized against the WHO standard. The test  result will be approximately 20% lower when compared  to the equimolar-standardized total PSA (Beckman  Coulter). Comparison of serial PSA results should be  interpreted with this fact in mind. . This test was performed using the Siemens  chemiluminescent method. Values obtained from  different assay methods cannot be used interchangeably. PSA levels, regardless of value, should not be interpreted as absolute evidence of the presence or absence of disease.      Past Medical History:  Diagnosis Date   Diabetes mellitus without complication (HCC)    Hypertension    Past Surgical History:  Procedure Laterality Date    INCISION AND DRAINAGE PERIRECTAL ABSCESS N/A 02/24/2016   Procedure: IRRIGATION AND DEBRIDEMENT PERIRECTAL ABSCESS AND ANAL FISTULA;  Surgeon: Avel Peace, MD;  Location: [facility];  Service: General;  Laterality: N/A;   thumb surgery     in high school , hx injury playing football   Current Outpatient Medications on File Prior to Visit  Medication Sig Dispense Refill   aspirin EC  81 MG tablet TAKE 1 TABLET BY MOUTH DAILY. SWALLOW WHOLE. 90 tablet 1   blood glucose meter kit and supplies Dispense based on patient and insurance preference. Use up to four times daily as directed. (FOR ICD-9 250.00, 250.01). 1 each 0   empagliflozin (JARDIANCE) 25 MG TABS tablet Take by mouth daily.     glucose blood (ONETOUCH ULTRA) test strip USE TO TEST BLOOD SUGAR ONCE A DAY 100 strip 3   Lancets (ONETOUCH DELICA PLUS LANCET33G) MISC USE AS DIRECTED TO TEST BLOOD SUGAR ONCE DAILY 100 each 0   lisinopril (ZESTRIL) 10 MG tablet TAKE 1 TABLET BY MOUTH EVERY DAY 90 tablet 1   metFORMIN (GLUCOPHAGE) 1000 MG tablet TAKE 1 TABLET BY MOUTH TWICE A DAY WITH MEALS 180 tablet 3   simvastatin (ZOCOR) 10 MG tablet TAKE 1 TABLET BY MOUTH EVERYDAY AT BEDTIME 90 tablet 0   tirzepatide (MOUNJARO) 7.5 MG/0.5ML Pen Inject 7.5 mg into the skin once a week. 6 mL 11   No current facility-administered medications on file prior to visit.   No Known Allergies Social History   Socioeconomic History   Marital status: Married    Spouse name: Not on file   Number of children: Not on file   Years of education: Not on file   Highest education level: Bachelor's degree (e.g., BA, AB, BS)  Occupational History   Not on file  Tobacco Use   Smoking status: Former    Types: Cigars    Quit date: 01/19/2016    Years since quitting: 6.7   Smokeless tobacco: Never  Substance and Sexual Activity   Alcohol use: Yes    Comment: Social   Drug use: No   Sexual activity: Not on file  Other Topics Concern   Not on file  Social History  Narrative   Not on file   Social Determinants of Health   Financial Resource Strain: Low Risk  (10/22/2022)   Overall Financial Resource Strain (CARDIA)    Difficulty of Paying Living Expenses: Not hard at all  Food Insecurity: No Food Insecurity (10/22/2022)   Hunger Vital Sign    Worried About Running Out of Food in the Last Year: Never true    Ran Out of Food in the Last Year: Never true  Transportation Needs: No Transportation Needs (10/22/2022)   PRAPARE - Administrator, Civil Service (Medical): No    Lack of Transportation (Non-Medical): No  Physical Activity: Sufficiently Active (10/22/2022)   Exercise Vital Sign    Days of Exercise per Week: 3 days    Minutes of Exercise per Session: 50 min  Stress: No Stress Concern Present (10/22/2022)   [employer] of Occupational Health - Occupational Stress Questionnaire    Feeling of Stress : Not at all  Social Connections: Moderately Integrated (10/22/2022)   Social Connection and Isolation Panel [NHANES]    Frequency of Communication with Friends and Family: Twice a week    Frequency of Social Gatherings with Friends and Family: Twice a week    Attends Religious Services: 1 to 4 times per year    Active Member of [employer] or Organizations: No    Attends Engineer, structural: Not on file    Marital Status: Married  Catering manager Violence: Not on file     Review of Systems  All other systems reviewed and are negative.      Objective:   Physical Exam Vitals reviewed.  Constitutional:  General: He is not in acute distress.    Appearance: Normal appearance. He is well-developed. He is not ill-appearing, toxic-appearing or diaphoretic.  HENT:     Head: Normocephalic and atraumatic.     Right Ear: Tympanic membrane, ear canal and external ear normal. There is no impacted cerumen.     Left Ear: Tympanic membrane, ear canal and external ear normal. There is no impacted cerumen.     Nose: Nose  normal. No congestion or rhinorrhea.     Mouth/Throat:     Mouth: Mucous membranes are moist.     Pharynx: Oropharynx is clear. No oropharyngeal exudate or posterior oropharyngeal erythema.  Eyes:     General: No scleral icterus.       Right eye: No discharge.        Left eye: No discharge.     Extraocular Movements: Extraocular movements intact.     Conjunctiva/sclera: Conjunctivae normal.     Pupils: Pupils are equal, round, and reactive to light.  Neck:     Thyroid: No thyromegaly.     Vascular: No carotid bruit or JVD.  Cardiovascular:     Rate and Rhythm: Normal rate and regular rhythm.     Pulses: Normal pulses.     Heart sounds: Normal heart sounds. No murmur heard.    No friction rub. No gallop.  Pulmonary:     Effort: Pulmonary effort is normal. No respiratory distress.     Breath sounds: Normal breath sounds. No stridor. No wheezing, rhonchi or rales.  Chest:     Chest wall: No tenderness.  Abdominal:     General: Bowel sounds are normal. There is no distension.     Palpations: Abdomen is soft. There is no mass.     Tenderness: There is no abdominal tenderness. There is no guarding or rebound.  Musculoskeletal:        General: No swelling, tenderness, deformity or signs of injury.     Cervical back: Neck supple. No rigidity.     Right lower leg: No edema.     Left lower leg: No edema.  Lymphadenopathy:     Cervical: No cervical adenopathy.  Skin:    General: Skin is warm.     Coloration: Skin is not jaundiced or pale.     Findings: No bruising, erythema, lesion or rash.  Neurological:     General: No focal deficit present.     Mental Status: He is alert and oriented to person, place, and time. Mental status is at baseline.     Cranial Nerves: No cranial nerve deficit.     Sensory: No sensory deficit.     Motor: No weakness.     Coordination: Coordination normal.     Gait: Gait normal.     Deep Tendon Reflexes: Reflexes normal.  Psychiatric:        Mood and  Affect: Mood normal.        Behavior: Behavior normal.        Thought Content: Thought content normal.        Judgment: Judgment normal.           Assessment & Plan:  Type 2 diabetes mellitus without complication, without long-term current use of insulin (HCC) - Plan: CT CARDIAC SCORING (SELF PAY ONLY)  Essential hypertension  Hyperlipidemia, unspecified hyperlipidemia type  Flu vaccine need - Plan: Flu Vaccine Trivalent High Dose (Fluad) Patient would like to get a cardiac CT/coronary artery calcium score for risk stratification.  In  the meantime I recommended increasing fish oil to 2000 mg daily.  Blood pressure is excellent.  I would increase Mounjaro to 10 mg subcutaneous weekly.  Recheck lab work in 6 months.  The remainder of his lab work is excellent.  Patient received his flu shot today.

## 2022-11-01 ENCOUNTER — Ambulatory Visit (HOSPITAL_COMMUNITY)
Admission: RE | Admit: 2022-11-01 | Discharge: 2022-11-01 | Disposition: A | Discharge status: Home / Self Care | Payer: BC Managed Care – PPO | Source: Ambulatory Visit | Attending: Family Medicine | Admitting: Family Medicine

## 2022-11-01 DIAGNOSIS — E119 Type 2 diabetes mellitus without complications: Secondary | ICD-10-CM | POA: Insufficient documentation

## 2022-11-01 LAB — HM DIABETES EYE EXAM

## 2022-11-15 ENCOUNTER — Other Ambulatory Visit: Payer: Self-pay | Admitting: Family Medicine

## 2022-11-15 DIAGNOSIS — E119 Type 2 diabetes mellitus without complications: Secondary | ICD-10-CM

## 2022-11-15 NOTE — Telephone Encounter (Signed)
Requested medications are due for refill today.  unsure  Requested medications are on the active medications list.  yes  Last refill. 12/26/2021 unknown quantity  Future visit scheduled.   yes  Notes to clinic.  Medication is historical.    Requested Prescriptions  Pending Prescriptions Disp Refills   JARDIANCE 25 MG TABS tablet [Pharmacy Med Name: JARDIANCE 25 MG TABLET] 90 tablet 3    Sig: TAKE 1 TABLET BY MOUTH EVERY DAY BEFORE BREAKFAST     Endocrinology:  Diabetes - SGLT2 Inhibitors Failed - 11/15/2022  7:34 AM      Failed - Valid encounter within last 6 months    Recent Outpatient Visits           2 years ago Need for immunization against influenza   [facility] Pickard, Priscille Heidelberg, MD   3 years ago Renal insufficiency   [facility] Donita Brooks, MD   3 years ago Type 2 diabetes mellitus without complication, without long-term current use of insulin (HCC)   [facility] Pickard, Priscille Heidelberg, MD   4 years ago Type 2 diabetes mellitus without complication, without long-term current use of insulin (HCC)   [facility] Pickard, Priscille Heidelberg, MD   5 years ago Essential hypertension   [facility] Dorena Bodo, PA-C       Future Appointments             In 5 months Pickard, Priscille Heidelberg, MD [facility]            Passed - Cr in normal range and within 360 days    Creat  Date Value Ref Range Status  10/22/2022 1.29 0.70 - 1.35 mg/dL Final   Creatinine, Urine  Date Value Ref Range Status  04/16/2022 118 20 - 320 mg/dL Final         Passed - HBA1C is between 0 and 7.9 and within 180 days    Hgb A1c MFr Bld  Date Value Ref Range Status  10/22/2022 7.2 (H) <5.7 % of total Hgb Final    Comment:    For someone without known diabetes, a hemoglobin A1c value of 6.5% or greater indicates that they may have  diabetes and this should be confirmed with a  follow-up  test. . For someone with known diabetes, a value <7% indicates  that their diabetes is well controlled and a value  greater than or equal to 7% indicates suboptimal  control. A1c targets should be individualized based on  duration of diabetes, age, comorbid conditions, and  other considerations. . Currently, no consensus exists regarding use of hemoglobin A1c for diagnosis of diabetes for children. .          Passed - eGFR in normal range and within 360 days    GFR, Est African American  Date Value Ref Range Status  01/15/2020 63 > OR = 60 mL/min/1.38m2 Final   GFR, Est Non African American  Date Value Ref Range Status  01/15/2020 54 (L) > OR = 60 mL/min/1.84m2 Final   eGFR  Date Value Ref Range Status  10/22/2022 62 > OR = 60 mL/min/1.21m2 Final

## 2022-11-21 ENCOUNTER — Telehealth: Payer: Self-pay

## 2022-11-21 ENCOUNTER — Other Ambulatory Visit: Payer: Self-pay

## 2022-11-21 DIAGNOSIS — E119 Type 2 diabetes mellitus without complications: Secondary | ICD-10-CM

## 2022-11-21 MED ORDER — EMPAGLIFLOZIN 25 MG PO TABS: 25.0000 mg | ORAL_TABLET | Freq: Every day | ORAL | 1 refills | Status: DC

## 2022-11-21 NOTE — Telephone Encounter (Signed)
Patientis out of refills jardiance 25 mg  and needs it sent over to   [facility] this is a urgent matter [phone] pt is out of meds ass well

## 2022-11-21 NOTE — Telephone Encounter (Signed)
Copied from CRM [id number]. Topic: Clinical - Prescription Issue >> Nov 21, 2022 10:49 AM Georgeanna Harrison H wrote: Reason for CRM: pt is having issues with pharmacy and prescription, and would like for someone in office to call him  Left message for pt to return call.

## 2022-11-21 NOTE — Telephone Encounter (Signed)
Copied from CRM [id number]. Topic: Clinical - Medication Question >> Nov 21, 2022  1:02 PM Dimitri Ped wrote: Reason for CRM: patient calling back to see have the doctor received authorization to release prescription.empagliflozin (JARDIANCE) 25 MG TABS tablet. Call back number [phone]. Collen asked patient to call back once he spoke to his health benefits

## 2022-11-26 ENCOUNTER — Encounter: Payer: Self-pay | Admitting: Family Medicine

## 2022-11-26 DIAGNOSIS — I712 Thoracic aortic aneurysm, without rupture, unspecified: Secondary | ICD-10-CM | POA: Insufficient documentation

## 2022-11-29 ENCOUNTER — Other Ambulatory Visit: Payer: Self-pay

## 2022-11-29 ENCOUNTER — Telehealth: Payer: Self-pay

## 2022-11-29 DIAGNOSIS — E785 Hyperlipidemia, unspecified: Secondary | ICD-10-CM

## 2022-11-29 MED ORDER — SIMVASTATIN 10 MG PO TABS: ORAL_TABLET | ORAL | 0 refills | Status: DC

## 2022-11-29 NOTE — Telephone Encounter (Signed)
Prescription Request  11/29/2022  LOV: 10/26/22  What is the name of the medication or equipment? simvastatin (ZOCOR) 10 MG tablet [[id number]]  Have you contacted your pharmacy to request a refill? Yes   Which pharmacy would you like this sent to?  [id number] [place], [place] - [address] [place] [address] Phone: [phone] Fax: [phone]    Patient notified that their request is being sent to the clinical staff for review and that they should receive a response within 2 business days.   Please advise at [facility] [phone]

## 2022-12-12 ENCOUNTER — Telehealth: Payer: Self-pay

## 2022-12-12 NOTE — Telephone Encounter (Signed)
LVM for re: pt's diabetic Eye exam. Please schedule pt for a diabetic appt on 12/26/22 or 12/28/22, let them know they are due for one

## 2022-12-26 ENCOUNTER — Other Ambulatory Visit: Payer: Self-pay | Admitting: Family Medicine

## 2022-12-26 DIAGNOSIS — I10 Essential (primary) hypertension: Secondary | ICD-10-CM

## 2022-12-26 DIAGNOSIS — E119 Type 2 diabetes mellitus without complications: Secondary | ICD-10-CM

## 2023-01-13 ENCOUNTER — Other Ambulatory Visit: Payer: Self-pay | Admitting: Family Medicine

## 2023-02-02 ENCOUNTER — Other Ambulatory Visit: Payer: Self-pay | Admitting: Family Medicine

## 2023-02-02 DIAGNOSIS — E119 Type 2 diabetes mellitus without complications: Secondary | ICD-10-CM

## 2023-03-05 ENCOUNTER — Other Ambulatory Visit: Payer: Self-pay | Admitting: Family Medicine

## 2023-03-05 DIAGNOSIS — E785 Hyperlipidemia, unspecified: Secondary | ICD-10-CM

## 2023-04-04 ENCOUNTER — Other Ambulatory Visit: Payer: Self-pay | Admitting: Family Medicine

## 2023-04-04 DIAGNOSIS — E119 Type 2 diabetes mellitus without complications: Secondary | ICD-10-CM

## 2023-04-05 NOTE — Telephone Encounter (Signed)
 Requested Prescriptions  Pending Prescriptions Disp Refills   metFORMIN (GLUCOPHAGE) 1000 MG tablet [Pharmacy Med Name: METFORMIN HCL 1,000 MG TABLET] 180 tablet 0    Sig: TAKE 1 TABLET BY MOUTH TWICE A DAY WITH FOOD     Endocrinology:  Diabetes - Biguanides Passed - 04/05/2023 10:57 AM      Passed - Cr in normal range and within 360 days    Creat  Date Value Ref Range Status  10/22/2022 1.29 0.70 - 1.35 mg/dL Final   Creatinine, Urine  Date Value Ref Range Status  04/16/2022 118 20 - 320 mg/dL Final         Passed - HBA1C is between 0 and 7.9 and within 180 days    Hgb A1c MFr Bld  Date Value Ref Range Status  10/22/2022 7.2 (H) <5.7 % of total Hgb Final    Comment:    For someone without known diabetes, a hemoglobin A1c value of 6.5% or greater indicates that they may have  diabetes and this should be confirmed with a follow-up  test. . For someone with known diabetes, a value <7% indicates  that their diabetes is well controlled and a value  greater than or equal to 7% indicates suboptimal  control. A1c targets should be individualized based on  duration of diabetes, age, comorbid conditions, and  other considerations. . Currently, no consensus exists regarding use of hemoglobin A1c for diagnosis of diabetes for children. .          Passed - eGFR in normal range and within 360 days    GFR, Est African American  Date Value Ref Range Status  01/15/2020 63 > OR = 60 mL/min/1.53m2 Final   GFR, Est Non African American  Date Value Ref Range Status  01/15/2020 54 (L) > OR = 60 mL/min/1.73m2 Final   eGFR  Date Value Ref Range Status  10/22/2022 62 > OR = 60 mL/min/1.94m2 Final         Passed - B12 Level in normal range and within 720 days    Vitamin B-12  Date Value Ref Range Status  04/16/2022 288 200 - 1,100 pg/mL Final    Comment:    . Please Note: Although the reference range for vitamin B12 is [phone] pg/mL, it has been reported that between 5 and 10% of  patients with values between 200 and 400 pg/mL may experience neuropsychiatric and hematologic abnormalities due to occult B12 deficiency; less than 1% of patients with values above 400 pg/mL will have symptoms. Paul Nichols - Valid encounter within last 6 months    Recent Outpatient Visits           5 months ago Type 2 diabetes mellitus without complication, without long-term current use of insulin (HCC)   [place] [facility] Donita Brooks, MD   11 months ago Type 2 diabetes mellitus without complication, without long-term current use of insulin [facility])   [place] Donita Brooks, MD   1 year ago Type 2 diabetes mellitus without complication, without long-term current use of insulin [facility])   [facility] Pickard, Priscille Heidelberg, MD       Future Appointments             In 4 weeks Pickard, Priscille Heidelberg, MD [facility], [facility]  Passed - CBC within normal limits and completed in the last 12 months    WBC  Date Value Ref Range Status  10/22/2022 3.8 3.8 - 10.8 Thousand/uL Final   RBC  Date Value Ref Range Status  10/22/2022 5.95 (H) 4.20 - 5.80 Million/uL Final   Hemoglobin  Date Value Ref Range Status  10/22/2022 13.7 13.2 - 17.1 g/dL Final   HCT  Date Value Ref Range Status  10/22/2022 44.9 38.5 - 50.0 % Final   MCHC  Date Value Ref Range Status  10/22/2022 30.5 (L) 32.0 - 36.0 g/dL Final    Comment:    For adults, a slight decrease in the calculated MCHC value (in the range of 30 to 32 g/dL) is most likely not clinically significant; however, it should be interpreted with caution in correlation with other red cell parameters and the patient's clinical condition.    [facility]  Date Value Ref Range Status  10/22/2022 23.0 (L) 27.0 - 33.0 pg Final   MCV  Date Value Ref Range Status  10/22/2022 75.5 (L) 80.0 - 100.0 fL Final   No results found  for: "PLTCOUNTKUC", "LABPLAT", "POCPLA" RDW  Date Value Ref Range Status  10/22/2022 16.0 (H) 11.0 - 15.0 % Final

## 2023-04-15 ENCOUNTER — Other Ambulatory Visit: Payer: Self-pay | Admitting: Family Medicine

## 2023-04-16 NOTE — Telephone Encounter (Signed)
 Requested Prescriptions  Pending Prescriptions Disp Refills   Lancets (ONETOUCH DELICA PLUS LANCET33G) MISC [Pharmacy Med Name: ONE TOUCH DELICA PLUS 33G LANC] 100 each 2    Sig: USE AS DIRECTED TO TEST BLOOD SUGAR ONCE DAILY     Endocrinology: Diabetes - Testing Supplies Passed - 04/16/2023  3:00 PM      Passed - Valid encounter within last 12 months    Recent Outpatient Visits           5 months ago Type 2 diabetes mellitus without complication, without long-term current use of insulin (HCC)   [place] [facility] Medicine Pickard, Priscille Heidelberg, MD   11 months ago Type 2 diabetes mellitus without complication, without long-term current use of insulin [facility])   [place] [facility] Medicine Donita Brooks, MD   1 year ago Type 2 diabetes mellitus without complication, without long-term current use of insulin [facility])   [place] [facility] Medicine Pickard, Priscille Heidelberg, MD       Future Appointments             In 2 weeks Pickard, Priscille Heidelberg, MD [facility], [facility]

## 2023-04-29 ENCOUNTER — Other Ambulatory Visit: Payer: BC Managed Care – PPO

## 2023-04-29 DIAGNOSIS — E785 Hyperlipidemia, unspecified: Secondary | ICD-10-CM | POA: Diagnosis not present

## 2023-04-29 DIAGNOSIS — E119 Type 2 diabetes mellitus without complications: Secondary | ICD-10-CM | POA: Diagnosis not present

## 2023-04-29 DIAGNOSIS — I10 Essential (primary) hypertension: Secondary | ICD-10-CM | POA: Diagnosis not present

## 2023-04-29 DIAGNOSIS — Z125 Encounter for screening for malignant neoplasm of prostate: Secondary | ICD-10-CM

## 2023-04-30 LAB — LIPID PANEL
Cholesterol: 139 mg/dL (ref <200)
HDL: 40 mg/dL (ref >=40)
LDL Cholesterol (Calc): 75 mg/dL
Non-HDL Cholesterol (Calc): 99 mg/dL (ref <130)
Total CHOL/HDL Ratio: 3.5 (calc) (ref <5.0)
Triglycerides: 158 mg/dL — ABNORMAL HIGH (ref <150)

## 2023-04-30 LAB — CBC WITH DIFFERENTIAL/PLATELET
Absolute Lymphocytes: 1817 {cells}/uL (ref 850–3900)
Absolute Monocytes: 511 {cells}/uL (ref 200–950)
Basophils Absolute: 20 {cells}/uL (ref 0–200)
Basophils Relative: 0.5 %
Eosinophils Absolute: 51 {cells}/uL (ref 15–500)
Eosinophils Relative: 1.3 %
HCT: 43.6 % (ref 38.5–50.0)
Hemoglobin: 13.3 g/dL (ref 13.2–17.1)
MCH: 23.3 pg — ABNORMAL LOW (ref 27.0–33.0)
MCHC: 30.5 g/dL — ABNORMAL LOW (ref 32.0–36.0)
MCV: 76.2 fL — ABNORMAL LOW (ref 80.0–100.0)
MPV: 11 fL (ref 7.5–12.5)
Monocytes Relative: 13.1 %
Neutro Abs: 1502 {cells}/uL (ref 1500–7800)
Neutrophils Relative %: 38.5 %
Platelets: 229 10*3/uL (ref 140–400)
RBC: 5.72 10*6/uL (ref 4.20–5.80)
RDW: 17.9 % — ABNORMAL HIGH (ref 11.0–15.0)
Total Lymphocyte: 46.6 %
WBC: 3.9 10*3/uL (ref 3.8–10.8)

## 2023-04-30 LAB — PROTEIN / CREATININE RATIO, URINE
Creatinine, Urine: 104 mg/dL (ref 20–320)
Protein/Creat Ratio: 96 mg/g{creat} (ref 25–148)
Protein/Creatinine Ratio: 0.096 mg/mg{creat} (ref 0.025–0.148)
Total Protein, Urine: 10 mg/dL (ref 5–25)

## 2023-04-30 LAB — COMPLETE METABOLIC PANEL WITHOUT GFR
AG Ratio: 1.7 (calc) (ref 1.0–2.5)
ALT: 16 U/L (ref 9–46)
AST: 15 U/L (ref 10–35)
Albumin: 4.5 g/dL (ref 3.6–5.1)
Alkaline phosphatase (APISO): 46 U/L (ref 35–144)
BUN: 19 mg/dL (ref 7–25)
CO2: 27 mmol/L (ref 20–32)
Calcium: 9.5 mg/dL (ref 8.6–10.3)
Chloride: 105 mmol/L (ref 98–110)
Creat: 1.19 mg/dL (ref 0.70–1.35)
Globulin: 2.7 g/dL (ref 1.9–3.7)
Glucose, Bld: 107 mg/dL — ABNORMAL HIGH (ref 65–99)
Potassium: 4.5 mmol/L (ref 3.5–5.3)
Sodium: 140 mmol/L (ref 135–146)
Total Bilirubin: 0.3 mg/dL (ref 0.2–1.2)
Total Protein: 7.2 g/dL (ref 6.1–8.1)

## 2023-04-30 LAB — HEMOGLOBIN A1C
Hgb A1c MFr Bld: 7.3 % — ABNORMAL HIGH (ref <5.7)
Mean Plasma Glucose: 163 mg/dL
eAG (mmol/L): 9 mmol/L

## 2023-05-03 ENCOUNTER — Ambulatory Visit: Payer: BC Managed Care – PPO | Admitting: Family Medicine

## 2023-05-03 ENCOUNTER — Encounter: Payer: Self-pay | Admitting: Family Medicine

## 2023-05-03 VITALS — BP 120/70 | HR 76 | Temp 97.5°F | Ht 69.0 in | Wt 183.4 lb

## 2023-05-03 DIAGNOSIS — E1169 Type 2 diabetes mellitus with other specified complication: Secondary | ICD-10-CM

## 2023-05-03 DIAGNOSIS — E785 Hyperlipidemia, unspecified: Secondary | ICD-10-CM | POA: Diagnosis not present

## 2023-05-03 DIAGNOSIS — E1159 Type 2 diabetes mellitus with other circulatory complications: Secondary | ICD-10-CM

## 2023-05-03 DIAGNOSIS — I10 Essential (primary) hypertension: Secondary | ICD-10-CM | POA: Diagnosis not present

## 2023-05-03 DIAGNOSIS — Z0000 Encounter for general adult medical examination without abnormal findings: Secondary | ICD-10-CM

## 2023-05-03 DIAGNOSIS — Z7984 Long term (current) use of oral hypoglycemic drugs: Secondary | ICD-10-CM

## 2023-05-03 DIAGNOSIS — Z0001 Encounter for general adult medical examination with abnormal findings: Secondary | ICD-10-CM

## 2023-05-03 DIAGNOSIS — E119 Type 2 diabetes mellitus without complications: Secondary | ICD-10-CM

## 2023-05-03 NOTE — Progress Notes (Signed)
 Subjective:    Patient ID: Paul Nichols, male    DOB: 20-Mar-1955, 68 y.o.   MRN: [id number]  HPI Patient is a very pleasant 68 year old African-American gentleman who is here today for complete physical exam.  Patient had a colonoscopy in 2019.  He is due for repeat colonoscopy in 2029.  His PSA was checked in October.  It was stable at that time and less than 2.  He is due to repeat that in October of this year.  At his last visit we ordered a coronary artery calcium score.  His was outstanding.  His score was 0!.  He is due for a booster on his pneumonia shot.  The remainder of his immunizations are up-to-date.  He saw his eye specialist earlier this year.  Diabetic foot exam was performed today and was normal  Lab on 04/29/2023  Component Date Value Ref Range Status   WBC 04/29/2023 3.9  3.8 - 10.8 Thousand/uL Final   RBC 04/29/2023 5.72  4.20 - 5.80 Million/uL Final   Hemoglobin 04/29/2023 13.3  13.2 - 17.1 g/dL Final   HCT 84/13/2440 43.6  38.5 - 50.0 % Final   MCV 04/29/2023 76.2 (L)  80.0 - 100.0 fL Final   MCH 04/29/2023 23.3 (L)  27.0 - 33.0 pg Final   MCHC 04/29/2023 30.5 (L)  32.0 - 36.0 g/dL Final   Comment: For adults, a slight decrease in the calculated MCHC value (in the range of 30 to 32 g/dL) is most likely not clinically significant; however, it should be interpreted with caution in correlation with other red cell parameters and the patient's clinical condition.    RDW 04/29/2023 17.9 (H)  11.0 - 15.0 % Final   Platelets 04/29/2023 229  140 - 400 Thousand/uL Final   MPV 04/29/2023 11.0  7.5 - 12.5 fL Final   Neutro Abs 04/29/2023 1,502  1,500 - 7,800 cells/uL Final   Absolute Lymphocytes 04/29/2023 1,817  850 - 3,900 cells/uL Final   Absolute Monocytes 04/29/2023 511  200 - 950 cells/uL Final   Eosinophils Absolute 04/29/2023 51  15 - 500 cells/uL Final   Basophils Absolute 04/29/2023 20  0 - 200 cells/uL Final   Neutrophils Relative % 04/29/2023 38.5  % Final    Total Lymphocyte 04/29/2023 46.6  % Final   Monocytes Relative 04/29/2023 13.1  % Final   Eosinophils Relative 04/29/2023 1.3  % Final   Basophils Relative 04/29/2023 0.5  % Final   Glucose, Bld 04/29/2023 107 (H)  65 - 99 mg/dL Final   Comment: .            Fasting reference interval . For someone without known diabetes, a glucose value between 100 and 125 mg/dL is consistent with prediabetes and should be confirmed with a follow-up test. .    BUN 04/29/2023 19  7 - 25 mg/dL Final   Creat 11/04/2534 1.19  0.70 - 1.35 mg/dL Final   BUN/Creatinine Ratio 04/29/2023 SEE NOTE:  6 - 22 (calc) Final   Comment:    Not Reported: BUN and Creatinine are within    reference range. .    Sodium 04/29/2023 140  135 - 146 mmol/L Final   Potassium 04/29/2023 4.5  3.5 - 5.3 mmol/L Final   Chloride 04/29/2023 105  98 - 110 mmol/L Final   CO2 04/29/2023 27  20 - 32 mmol/L Final   Calcium 04/29/2023 9.5  8.6 - 10.3 mg/dL Final   Total Protein 64/40/3474 7.2  6.1 -  8.1 g/dL Final   Albumin 16/10/9602 4.5  3.6 - 5.1 g/dL Final   Globulin 54/09/8117 2.7  1.9 - 3.7 g/dL (calc) Final   AG Ratio 04/29/2023 1.7  1.0 - 2.5 (calc) Final   Total Bilirubin 04/29/2023 0.3  0.2 - 1.2 mg/dL Final   Alkaline phosphatase (APISO) 04/29/2023 46  35 - 144 U/L Final   AST 04/29/2023 15  10 - 35 U/L Final   ALT 04/29/2023 16  9 - 46 U/L Final   Hgb A1c MFr Bld 04/29/2023 7.3 (H)  <5.7 % Final   Comment: For someone without known diabetes, a hemoglobin A1c value of 6.5% or greater indicates that they may have  diabetes and this should be confirmed with a follow-up  test. . For someone with known diabetes, a value <7% indicates  that their diabetes is well controlled and a value  greater than or equal to 7% indicates suboptimal  control. A1c targets should be individualized based on  duration of diabetes, age, comorbid conditions, and  other considerations. . Currently, no consensus exists regarding use  of hemoglobin A1c for diagnosis of diabetes for children. .    Mean Plasma Glucose 04/29/2023 163  mg/dL Final   eAG (mmol/L) 14/78/2956 9.0  mmol/L Final   Cholesterol 04/29/2023 139  <200 mg/dL Final   HDL 21/30/8657 40  > OR = 40 mg/dL Final   Triglycerides 84/69/6295 158 (H)  <150 mg/dL Final   LDL Cholesterol (Calc) 04/29/2023 75  mg/dL (calc) Final   Comment: Reference range: <100 . Desirable range <100 mg/dL for primary prevention;   <70 mg/dL for patients with CHD or diabetic patients  with > or = 2 CHD risk factors. [name] LDL-C is now calculated using the Martin-Hopkins  calculation, which is a validated novel method providing  better accuracy than the Friedewald equation in the  estimation of LDL-C.  [name] et al. [name]. 2841;324(40): 2061-2068  (http://education.[facility].com/faq/FAQ164)    Total CHOL/HDL Ratio 04/29/2023 3.5  <1.0 (calc) Final   Non-HDL Cholesterol (Calc) 04/29/2023 99  <130 mg/dL (calc) Final   Comment: For patients with diabetes plus 1 major ASCVD risk  factor, treating to a non-HDL-C goal of <100 mg/dL  (LDL-C of <27 mg/dL) is considered a therapeutic  option.    Creatinine, Urine 04/29/2023 104  20 - 320 mg/dL Final   Protein/Creat Ratio 04/29/2023 96  25 - 148 mg/g creat Final   Protein/Creatinine Ratio 04/29/2023 0.096  0.025 - 0.148 mg/mg creat Final   Total Protein, Urine 04/29/2023 10  5 - 25 mg/dL Final    Past Medical History:  Diagnosis Date   Diabetes mellitus without complication (HCC)    Hypertension    Thoracic aortic aneurysm [facility])    Past Surgical History:  Procedure Laterality Date   INCISION AND DRAINAGE PERIRECTAL ABSCESS N/A 02/24/2016   Procedure: IRRIGATION AND DEBRIDEMENT PERIRECTAL ABSCESS AND ANAL FISTULA;  Surgeon: Adalberto Hollow, MD;  Location: [facility];  Service: General;  Laterality: N/A;   thumb surgery     in high school , hx injury playing football   Current Outpatient Medications on File Prior to Visit   Medication Sig Dispense Refill   aspirin  EC 81 MG tablet TAKE 1 TABLET BY MOUTH DAILY. SWALLOW WHOLE. 90 tablet 1   blood glucose meter kit and supplies Dispense based on patient and insurance preference. Use up to four times daily as directed. (FOR ICD-9 250.00, 250.01). 1 each 0   empagliflozin  (JARDIANCE ) 25  MG TABS tablet Take 1 tablet (25 mg total) by mouth daily. 90 tablet 1   glucose blood (ONETOUCH ULTRA) test strip USE TO TEST BLOOD SUGAR ONCE A DAY 100 strip 3   Lancets (ONETOUCH DELICA PLUS LANCET33G) MISC USE AS DIRECTED TO TEST BLOOD SUGAR ONCE DAILY 100 each 1   lisinopril  (ZESTRIL ) 10 MG tablet TAKE 1 TABLET BY MOUTH EVERY DAY 90 tablet 1   metFORMIN  (GLUCOPHAGE ) 1000 MG tablet TAKE 1 TABLET BY MOUTH TWICE A DAY WITH FOOD 180 tablet 0   simvastatin  (ZOCOR ) 10 MG tablet TAKE 1 TABLET BY MOUTH EVERYDAY AT BEDTIME 90 tablet 0   tirzepatide  (MOUNJARO ) 10 MG/0.5ML Pen Inject 10 mg into the skin once a week. 6 mL 5   No current facility-administered medications on file prior to visit.   No Known Allergies Social History   Socioeconomic History   Marital status: Married    Spouse name: Not on file   Number of children: Not on file   Years of education: Not on file   Highest education level: Bachelor's degree (e.g., BA, AB, BS)  Occupational History   Not on file  Tobacco Use   Smoking status: Former    Types: Cigars    Quit date: 01/19/2016    Years since quitting: 7.2   Smokeless tobacco: Never  Substance and Sexual Activity   Alcohol use: Yes    Comment: Social   Drug use: No   Sexual activity: Not on file  Other Topics Concern   Not on file  Social History Narrative   Not on file   Social Drivers of Health   Financial Resource Strain: Low Risk  (04/29/2023)   Overall Financial Resource Strain (CARDIA)    Difficulty of Paying Living Expenses: Not hard at all  Food Insecurity: No Food Insecurity (04/29/2023)   Hunger Vital Sign    Worried About Running Out of  Food in the Last Year: Never true    Ran Out of Food in the Last Year: Never true  Transportation Needs: No Transportation Needs (04/29/2023)   PRAPARE - Administrator, Civil Service (Medical): No    Lack of Transportation (Non-Medical): No  Physical Activity: Sufficiently Active (04/29/2023)   Exercise Vital Sign    Days of Exercise per Week: 3 days    Minutes of Exercise per Session: 50 min  Stress: No Stress Concern Present (04/29/2023)   [employer] of Occupational Health - Occupational Stress Questionnaire    Feeling of Stress : Not at all  Social Connections: Moderately Integrated (04/29/2023)   Social Connection and Isolation Panel [NHANES]    Frequency of Communication with Friends and Family: More than three times a week    Frequency of Social Gatherings with Friends and Family: Twice a week    Attends Religious Services: 1 to 4 times per year    Active Member of [employer] or Organizations: No    Attends Engineer, structural: Not on file    Marital Status: Married  Catering manager Violence: Not on file     Review of Systems  All other systems reviewed and are negative.      Objective:   Physical Exam Vitals reviewed.  Constitutional:      General: He is not in acute distress.    Appearance: Normal appearance. He is well-developed. He is not ill-appearing, toxic-appearing or diaphoretic.  HENT:     Head: Normocephalic and atraumatic.     Right Ear: Tympanic  membrane, ear canal and external ear normal. There is no impacted cerumen.     Left Ear: Tympanic membrane, ear canal and external ear normal. There is no impacted cerumen.     Nose: Nose normal. No congestion or rhinorrhea.     Mouth/Throat:     Mouth: Mucous membranes are moist.     Pharynx: Oropharynx is clear. No oropharyngeal exudate or posterior oropharyngeal erythema.  Eyes:     General: No scleral icterus.       Right eye: No discharge.        Left eye: No discharge.      Extraocular Movements: Extraocular movements intact.     Conjunctiva/sclera: Conjunctivae normal.     Pupils: Pupils are equal, round, and reactive to light.  Neck:     Thyroid: No thyromegaly.     Vascular: No carotid bruit or JVD.  Cardiovascular:     Rate and Rhythm: Normal rate and regular rhythm.     Pulses: Normal pulses.     Heart sounds: Normal heart sounds. No murmur heard.    No friction rub. No gallop.  Pulmonary:     Effort: Pulmonary effort is normal. No respiratory distress.     Breath sounds: Normal breath sounds. No stridor. No wheezing, rhonchi or rales.  Chest:     Chest wall: No tenderness.  Abdominal:     General: Bowel sounds are normal. There is no distension.     Palpations: Abdomen is soft. There is no mass.     Tenderness: There is no abdominal tenderness. There is no guarding or rebound.  Musculoskeletal:        General: No swelling, tenderness, deformity or signs of injury.     Cervical back: Neck supple. No rigidity.     Right lower leg: No edema.     Left lower leg: No edema.  Lymphadenopathy:     Cervical: No cervical adenopathy.  Skin:    General: Skin is warm.     Coloration: Skin is not jaundiced or pale.     Findings: No bruising, erythema, lesion or rash.  Neurological:     General: No focal deficit present.     Mental Status: He is alert and oriented to person, place, and time. Mental status is at baseline.     Cranial Nerves: No cranial nerve deficit.     Sensory: No sensory deficit.     Motor: No weakness.     Coordination: Coordination normal.     Gait: Gait normal.     Deep Tendon Reflexes: Reflexes normal.  Psychiatric:        Mood and Affect: Mood normal.        Behavior: Behavior normal.        Thought Content: Thought content normal.        Judgment: Judgment normal.           Assessment & Plan:  General medical exam  Type 2 diabetes mellitus without complication, without long-term current use of insulin   (HCC)  Hyperlipidemia, unspecified hyperlipidemia type  Essential hypertension Patient's physical exam today is completely normal.  His blood work is excellent except for his A1c which is mildly elevated at 7.3.  Together we have decided to increase Mounjaro .  He is only been on the 10 mg dose for 1 month.  He will email me when he is ready for me to increase the dose higher.  I would recheck his blood work in 6 months.  His  cholesterol is outstanding.  Blood pressure is excellent.  Cancer screening is up-to-date.  We discussed the pneumonia vaccine and the patient elected to receive capvaxive at his local pharmacy.

## 2023-05-13 ENCOUNTER — Other Ambulatory Visit: Payer: Self-pay | Admitting: Family Medicine

## 2023-05-13 DIAGNOSIS — E119 Type 2 diabetes mellitus without complications: Secondary | ICD-10-CM

## 2023-05-14 NOTE — Telephone Encounter (Signed)
 Requested Prescriptions  Refused Prescriptions Disp Refills   JARDIANCE  25 MG TABS tablet [Pharmacy Med Name: JARDIANCE  25 MG TABLET] 90 tablet 1    Sig: TAKE 1 TABLET (25 MG TOTAL) BY MOUTH DAILY.     Endocrinology:  Diabetes - SGLT2 Inhibitors Passed - 05/14/2023  3:28 PM      Passed - Cr in normal range and within 360 days    Creat  Date Value Ref Range Status  04/29/2023 1.19 0.70 - 1.35 mg/dL Final   Creatinine, Urine  Date Value Ref Range Status  04/29/2023 104 20 - 320 mg/dL Final         Passed - HBA1C is between 0 and 7.9 and within 180 days    Hgb A1c MFr Bld  Date Value Ref Range Status  04/29/2023 7.3 (H) <5.7 % Final    Comment:    For someone without known diabetes, a hemoglobin A1c value of 6.5% or greater indicates that they may have  diabetes and this should be confirmed with a follow-up  test. . For someone with known diabetes, a value <7% indicates  that their diabetes is well controlled and a value  greater than or equal to 7% indicates suboptimal  control. A1c targets should be individualized based on  duration of diabetes, age, comorbid conditions, and  other considerations. . Currently, no consensus exists regarding use of hemoglobin A1c for diagnosis of diabetes for children. .          Passed - eGFR in normal range and within 360 days    GFR, Est African American  Date Value Ref Range Status  01/15/2020 63 > OR = 60 mL/min/1.86m2 Final   GFR, Est Non African American  Date Value Ref Range Status  01/15/2020 54 (L) > OR = 60 mL/min/1.59m2 Final   eGFR  Date Value Ref Range Status  10/22/2022 62 > OR = 60 mL/min/1.42m2 Final         Passed - Valid encounter within last 6 months    Recent Outpatient Visits           1 week ago General medical exam   [place] [facility] Medicine Austine Lefort, MD   6 months ago Type 2 diabetes mellitus without complication, without long-term current use of insulin  [facility])   [place]  [facility] Austine Lefort, MD   1 year ago Type 2 diabetes mellitus without complication, without long-term current use of insulin  [facility])   [place] [facility] Medicine Austine Lefort, MD   1 year ago Type 2 diabetes mellitus without complication, without long-term current use of insulin  [facility])   [place] [facility] Family Medicine Pickard, Cisco Crest, MD

## 2023-05-15 ENCOUNTER — Encounter: Payer: Self-pay | Admitting: Family Medicine

## 2023-05-15 ENCOUNTER — Other Ambulatory Visit: Payer: Self-pay

## 2023-05-15 DIAGNOSIS — E119 Type 2 diabetes mellitus without complications: Secondary | ICD-10-CM

## 2023-05-15 MED ORDER — EMPAGLIFLOZIN 25 MG PO TABS: 25.0000 mg | ORAL_TABLET | Freq: Every day | ORAL | 3 refills | Status: DC

## 2023-05-31 ENCOUNTER — Other Ambulatory Visit: Payer: Self-pay | Admitting: Family Medicine

## 2023-05-31 ENCOUNTER — Other Ambulatory Visit: Payer: Self-pay

## 2023-05-31 DIAGNOSIS — E119 Type 2 diabetes mellitus without complications: Secondary | ICD-10-CM

## 2023-05-31 MED ORDER — BLOOD GLUCOSE MONITORING SUPPL DEVI: 1.0000 | Freq: Every day | 0 refills | Status: AC

## 2023-05-31 MED ORDER — LANCET DEVICE MISC: 0 refills | Status: AC

## 2023-05-31 MED ORDER — LANCETS MISC. MISC
0 refills | Status: AC
Start: 2023-05-31 — End: ?

## 2023-05-31 MED ORDER — BLOOD GLUCOSE TEST VI STRP: ORAL_STRIP | 1 refills | Status: DC

## 2023-06-01 ENCOUNTER — Other Ambulatory Visit: Payer: Self-pay | Admitting: Family Medicine

## 2023-06-01 DIAGNOSIS — E785 Hyperlipidemia, unspecified: Secondary | ICD-10-CM

## 2023-06-05 NOTE — Telephone Encounter (Signed)
 Requested Prescriptions  Pending Prescriptions Disp Refills   simvastatin  (ZOCOR ) 10 MG tablet [Pharmacy Med Name: SIMVASTATIN  10 MG TABLET] 90 tablet 1    Sig: TAKE 1 TABLET BY MOUTH EVERYDAY AT BEDTIME     Cardiovascular:  Antilipid - Statins Failed - 06/05/2023  9:03 AM      Failed - Lipid Panel in normal range within the last 12 months    Cholesterol  Date Value Ref Range Status  04/29/2023 139 <200 mg/dL Final   LDL Cholesterol (Calc)  Date Value Ref Range Status  04/29/2023 75 mg/dL (calc) Final    Comment:    Reference range: <100 . Desirable range <100 mg/dL for primary prevention;   <70 mg/dL for patients with CHD or diabetic patients  with > or = 2 CHD risk factors. [name] LDL-C is now calculated using the Martin-Hopkins  calculation, which is a validated novel method providing  better accuracy than the Friedewald equation in the  estimation of LDL-C.  [name] et al. [name]. 4098;119(14): 2061-2068  (http://education.[facility].com/faq/FAQ164)    HDL  Date Value Ref Range Status  04/29/2023 40 > OR = 40 mg/dL Final   Triglycerides  Date Value Ref Range Status  04/29/2023 158 (H) <150 mg/dL Final         Passed - Patient is not pregnant      Passed - Valid encounter within last 12 months    Recent Outpatient Visits           1 month ago General medical exam   [place] [facility] Medicine Austine Lefort, MD   7 months ago Type 2 diabetes mellitus without complication, without long-term current use of insulin  [facility]   [place] [facility] Medicine Austine Lefort, MD   1 year ago Type 2 diabetes mellitus without complication, without long-term current use of insulin  [facility])   [place] [facility] Medicine Austine Lefort, MD   1 year ago Type 2 diabetes mellitus without complication, without long-term current use of insulin  [facility])    [facility] Family Medicine Pickard, Cisco Crest, MD

## 2023-06-13 ENCOUNTER — Other Ambulatory Visit: Payer: Self-pay | Admitting: Family Medicine

## 2023-06-13 DIAGNOSIS — E119 Type 2 diabetes mellitus without complications: Secondary | ICD-10-CM

## 2023-06-13 DIAGNOSIS — I10 Essential (primary) hypertension: Secondary | ICD-10-CM

## 2023-06-14 NOTE — Telephone Encounter (Signed)
 Requested Prescriptions  Pending Prescriptions Disp Refills   lisinopril  (ZESTRIL ) 10 MG tablet [Pharmacy Med Name: LISINOPRIL  10 MG TABLET] 90 tablet 1    Sig: TAKE 1 TABLET BY MOUTH EVERY DAY     Cardiovascular:  ACE Inhibitors Passed - 06/14/2023  9:31 AM      Passed - Cr in normal range and within 180 days    Creat  Date Value Ref Range Status  04/29/2023 1.19 0.70 - 1.35 mg/dL Final   Creatinine, Urine  Date Value Ref Range Status  04/29/2023 104 20 - 320 mg/dL Final         Passed - K in normal range and within 180 days    Potassium  Date Value Ref Range Status  04/29/2023 4.5 3.5 - 5.3 mmol/L Final         Passed - Patient is not pregnant      Passed - Last BP in normal range    BP Readings from Last 1 Encounters:  05/03/23 120/70         Passed - Valid encounter within last 6 months    Recent Outpatient Visits           1 month ago General medical exam   [place] [facility] Medicine Austine Lefort, MD   7 months ago Type 2 diabetes mellitus without complication, without long-term current use of insulin  [facility])   [place] [facility] Medicine Austine Lefort, MD   1 year ago Type 2 diabetes mellitus without complication, without long-term current use of insulin  [facility])   [place] [facility] Austine Lefort, MD   1 year ago Type 2 diabetes mellitus without complication, without long-term current use of insulin  [facility])   [place] [facility] Family Medicine Pickard, Cisco Crest, MD

## 2023-07-07 ENCOUNTER — Other Ambulatory Visit: Payer: Self-pay | Admitting: Family Medicine

## 2023-07-07 DIAGNOSIS — E119 Type 2 diabetes mellitus without complications: Secondary | ICD-10-CM

## 2023-07-26 ENCOUNTER — Other Ambulatory Visit: Payer: Self-pay | Admitting: Family Medicine

## 2023-07-26 DIAGNOSIS — E119 Type 2 diabetes mellitus without complications: Secondary | ICD-10-CM

## 2023-07-26 NOTE — Telephone Encounter (Signed)
 Requested Prescriptions  Pending Prescriptions Disp Refills   aspirin  EC (ASPIRIN  LOW DOSE) 81 MG tablet [Pharmacy Med Name: ASPIRIN  EC 81 MG TABLET] 90 tablet 2    Sig: TAKE 1 TABLET BY MOUTH DAILY. SWALLOW WHOLE.     Analgesics:  NSAIDS - aspirin  Passed - 07/26/2023  4:12 PM      Passed - Cr in normal range and within 360 days    Creat  Date Value Ref Range Status  04/29/2023 1.19 0.70 - 1.35 mg/dL Final   Creatinine, Urine  Date Value Ref Range Status  04/29/2023 104 20 - 320 mg/dL Final         Passed - eGFR is 10 or above and within 360 days    GFR, Est African American  Date Value Ref Range Status  01/15/2020 63 > OR = 60 mL/min/1.48m2 Final   GFR, Est Non African American  Date Value Ref Range Status  01/15/2020 54 (L) > OR = 60 mL/min/1.41m2 Final   eGFR  Date Value Ref Range Status  10/22/2022 62 > OR = 60 mL/min/1.36m2 Final         Passed - Patient is not pregnant      Passed - Valid encounter within last 12 months    Recent Outpatient Visits           2 months ago General medical exam   [place] [facility] Duanne Butler DASEN, MD   9 months ago Type 2 diabetes mellitus without complication, without long-term current use of insulin  [facility])   [place] [facility] Medicine Duanne Butler DASEN, MD   1 year ago Type 2 diabetes mellitus without complication, without long-term current use of insulin  [facility])   [place] [facility] Medicine Duanne Butler DASEN, MD   1 year ago Type 2 diabetes mellitus without complication, without long-term current use of insulin  [facility])    [place] [facility] Medicine Pickard, Butler DASEN, MD

## 2023-10-11 ENCOUNTER — Other Ambulatory Visit: Payer: Self-pay | Admitting: Family Medicine

## 2023-10-11 DIAGNOSIS — E119 Type 2 diabetes mellitus without complications: Secondary | ICD-10-CM

## 2023-10-24 ENCOUNTER — Other Ambulatory Visit

## 2023-10-24 DIAGNOSIS — E119 Type 2 diabetes mellitus without complications: Secondary | ICD-10-CM

## 2023-10-24 DIAGNOSIS — E785 Hyperlipidemia, unspecified: Secondary | ICD-10-CM

## 2023-10-25 ENCOUNTER — Ambulatory Visit: Payer: Self-pay | Admitting: Family Medicine

## 2023-10-25 LAB — CBC WITH DIFFERENTIAL/PLATELET
Absolute Lymphocytes: 1901 {cells}/uL (ref 850–3900)
Absolute Monocytes: 486 {cells}/uL (ref 200–950)
Basophils Absolute: 30 {cells}/uL (ref 0–200)
Basophils Relative: 0.7 %
Eosinophils Absolute: 82 {cells}/uL (ref 15–500)
Eosinophils Relative: 1.9 %
HCT: 44.5 % (ref 38.5–50.0)
Hemoglobin: 13.6 g/dL (ref 13.2–17.1)
MCH: 23.5 pg — ABNORMAL LOW (ref 27.0–33.0)
MCHC: 30.6 g/dL — ABNORMAL LOW (ref 32.0–36.0)
MCV: 77 fL — ABNORMAL LOW (ref 80.0–100.0)
MPV: 11 fL (ref 7.5–12.5)
Monocytes Relative: 11.3 %
Neutro Abs: 1802 {cells}/uL (ref 1500–7800)
Neutrophils Relative %: 41.9 %
Platelets: 245 Thousand/uL (ref 140–400)
RBC: 5.78 Million/uL (ref 4.20–5.80)
RDW: 16 % — ABNORMAL HIGH (ref 11.0–15.0)
Total Lymphocyte: 44.2 %
WBC: 4.3 Thousand/uL (ref 3.8–10.8)

## 2023-10-25 LAB — HEMOGLOBIN A1C
Hgb A1c MFr Bld: 6.8 % — ABNORMAL HIGH (ref <5.7)
Mean Plasma Glucose: 148 mg/dL
eAG (mmol/L): 8.2 mmol/L

## 2023-10-25 LAB — COMPREHENSIVE METABOLIC PANEL WITH GFR
AG Ratio: 1.8 (calc) (ref 1.0–2.5)
ALT: 13 U/L (ref 9–46)
AST: 14 U/L (ref 10–35)
Albumin: 4.9 g/dL (ref 3.6–5.1)
Alkaline phosphatase (APISO): 47 U/L (ref 35–144)
BUN: 22 mg/dL (ref 7–25)
CO2: 25 mmol/L (ref 20–32)
Calcium: 9.9 mg/dL (ref 8.6–10.3)
Chloride: 104 mmol/L (ref 98–110)
Creat: 1.15 mg/dL (ref 0.70–1.35)
Globulin: 2.7 g/dL (ref 1.9–3.7)
Glucose, Bld: 106 mg/dL — ABNORMAL HIGH (ref 65–99)
Potassium: 4.6 mmol/L (ref 3.5–5.3)
Sodium: 139 mmol/L (ref 135–146)
Total Bilirubin: 0.4 mg/dL (ref 0.2–1.2)
Total Protein: 7.6 g/dL (ref 6.1–8.1)
eGFR: 70 mL/min/1.73m2 (ref >=60)

## 2023-10-25 LAB — LIPID PANEL
Cholesterol: 110 mg/dL (ref <200)
HDL: 44 mg/dL (ref >=40)
LDL Cholesterol (Calc): 50 mg/dL
Non-HDL Cholesterol (Calc): 66 mg/dL (ref <130)
Total CHOL/HDL Ratio: 2.5 (calc) (ref <5.0)
Triglycerides: 78 mg/dL (ref <150)

## 2023-11-04 ENCOUNTER — Encounter: Payer: Self-pay | Admitting: Family Medicine

## 2023-11-04 ENCOUNTER — Ambulatory Visit: Admitting: Family Medicine

## 2023-11-04 ENCOUNTER — Ambulatory Visit (INDEPENDENT_AMBULATORY_CARE_PROVIDER_SITE_OTHER): Admitting: Family Medicine

## 2023-11-04 VITALS — BP 116/74 | HR 76 | Temp 97.9°F | Ht 69.0 in | Wt 182.6 lb

## 2023-11-04 DIAGNOSIS — Z125 Encounter for screening for malignant neoplasm of prostate: Secondary | ICD-10-CM

## 2023-11-04 DIAGNOSIS — Z7984 Long term (current) use of oral hypoglycemic drugs: Secondary | ICD-10-CM | POA: Diagnosis not present

## 2023-11-04 DIAGNOSIS — I10 Essential (primary) hypertension: Secondary | ICD-10-CM | POA: Diagnosis not present

## 2023-11-04 DIAGNOSIS — E119 Type 2 diabetes mellitus without complications: Secondary | ICD-10-CM | POA: Diagnosis not present

## 2023-11-04 MED ORDER — TIRZEPATIDE 10 MG/0.5ML ~~LOC~~ SOAJ: 10.0000 mg | SUBCUTANEOUS | 5 refills | Status: AC

## 2023-11-04 MED ORDER — EMPAGLIFLOZIN 25 MG PO TABS: 25.0000 mg | ORAL_TABLET | Freq: Every day | ORAL | 3 refills | Status: AC

## 2023-11-04 NOTE — Progress Notes (Signed)
 Subjective:    Patient ID: Paul Nichols, male    DOB: January 23, 1955, 68 y.o.   MRN: [id number]  HPI Last year, we obtained a coronary artery calcium score for this patient.  His total score was 0!.  The patient would like to discontinue the simvastatin .  There was a coincidental finding of a 38 mm ectasia in the aorta.  Patient was made aware of that today.  We decided that we would check this again in a year for any growth.  His most recent lab work is listed below.  Labs are outstanding.  Blood pressure is excellent 116/74.  However the patient's brother recently passed away from prostate cancer at age 47. Lab on 10/24/2023  Component Date Value Ref Range Status   WBC 10/24/2023 4.3  3.8 - 10.8 Thousand/uL Final   RBC 10/24/2023 5.78  4.20 - 5.80 Million/uL Final   Hemoglobin 10/24/2023 13.6  13.2 - 17.1 g/dL Final   HCT 89/83/7974 44.5  38.5 - 50.0 % Final   MCV 10/24/2023 77.0 (L)  80.0 - 100.0 fL Final   MCH 10/24/2023 23.5 (L)  27.0 - 33.0 pg Final   MCHC 10/24/2023 30.6 (L)  32.0 - 36.0 g/dL Final   Comment: For adults, a slight decrease in the calculated MCHC value (in the range of 30 to 32 g/dL) is most likely not clinically significant; however, it should be interpreted with caution in correlation with other red cell parameters and the patient's clinical condition.    RDW 10/24/2023 16.0 (H)  11.0 - 15.0 % Final   Platelets 10/24/2023 245  140 - 400 Thousand/uL Final   MPV 10/24/2023 11.0  7.5 - 12.5 fL Final   Neutro Abs 10/24/2023 1,802  1,500 - 7,800 cells/uL Final   Absolute Lymphocytes 10/24/2023 1,901  850 - 3,900 cells/uL Final   Absolute Monocytes 10/24/2023 486  200 - 950 cells/uL Final   Eosinophils Absolute 10/24/2023 82  15 - 500 cells/uL Final   Basophils Absolute 10/24/2023 30  0 - 200 cells/uL Final   Neutrophils Relative % 10/24/2023 41.9  % Final   Total Lymphocyte 10/24/2023 44.2  % Final   Monocytes Relative 10/24/2023 11.3  % Final   Eosinophils Relative  10/24/2023 1.9  % Final   Basophils Relative 10/24/2023 0.7  % Final   Glucose, Bld 10/24/2023 106 (H)  65 - 99 mg/dL Final   Comment: .            Fasting reference interval . For someone without known diabetes, a glucose value between 100 and 125 mg/dL is consistent with prediabetes and should be confirmed with a follow-up test. .    BUN 10/24/2023 22  7 - 25 mg/dL Final   Creat 89/83/7974 1.15  0.70 - 1.35 mg/dL Final   eGFR 89/83/7974 70  > OR = 60 mL/min/1.21m2 Final   BUN/Creatinine Ratio 10/24/2023 SEE NOTE:  6 - 22 (calc) Final   Comment:    Not Reported: BUN and Creatinine are within    reference range. .    Sodium 10/24/2023 139  135 - 146 mmol/L Final   Potassium 10/24/2023 4.6  3.5 - 5.3 mmol/L Final   Chloride 10/24/2023 104  98 - 110 mmol/L Final   CO2 10/24/2023 25  20 - 32 mmol/L Final   Calcium 10/24/2023 9.9  8.6 - 10.3 mg/dL Final   Total Protein 89/83/7974 7.6  6.1 - 8.1 g/dL Final   Albumin 89/83/7974 4.9  3.6 - 5.1  g/dL Final   Globulin 89/83/7974 2.7  1.9 - 3.7 g/dL (calc) Final   AG Ratio 10/24/2023 1.8  1.0 - 2.5 (calc) Final   Total Bilirubin 10/24/2023 0.4  0.2 - 1.2 mg/dL Final   Alkaline phosphatase (APISO) 10/24/2023 47  35 - 144 U/L Final   AST 10/24/2023 14  10 - 35 U/L Final   ALT 10/24/2023 13  9 - 46 U/L Final   Cholesterol 10/24/2023 110  <200 mg/dL Final   HDL 89/83/7974 44  > OR = 40 mg/dL Final   Triglycerides 89/83/7974 78  <150 mg/dL Final   LDL Cholesterol (Calc) 10/24/2023 50  mg/dL (calc) Final   Comment: Reference range: <100 . Desirable range <100 mg/dL for primary prevention;   <70 mg/dL for patients with CHD or diabetic patients  with > or = 2 CHD risk factors. SABRA LDL-C is now calculated using the Martin-Hopkins  calculation, which is a validated novel method providing  better accuracy than the Friedewald equation in the  estimation of LDL-C.  Gladis APPLETHWAITE et al. SANDREA. 7986;689(80): 2061-2068   (http://education.[facility].com/faq/FAQ164)    Total CHOL/HDL Ratio 10/24/2023 2.5  <4.9 (calc) Final   Non-HDL Cholesterol (Calc) 10/24/2023 66  <130 mg/dL (calc) Final   Comment: For patients with diabetes plus 1 major ASCVD risk  factor, treating to a non-HDL-C goal of <100 mg/dL  (LDL-C of <29 mg/dL) is considered a therapeutic  option.    Hgb A1c MFr Bld 10/24/2023 6.8 (H)  <5.7 % Final   Comment: For someone without known diabetes, a hemoglobin A1c value of 6.5% or greater indicates that they may have  diabetes and this should be confirmed with a follow-up  test. . For someone with known diabetes, a value <7% indicates  that their diabetes is well controlled and a value  greater than or equal to 7% indicates suboptimal  control. A1c targets should be individualized based on  duration of diabetes, age, comorbid conditions, and  other considerations. . Currently, no consensus exists regarding use of hemoglobin A1c for diagnosis of diabetes for children. .    Mean Plasma Glucose 10/24/2023 148  mg/dL Final   eAG (mmol/L) 89/83/7974 8.2  mmol/L Final     Past Medical History:  Diagnosis Date   Diabetes mellitus without complication (HCC)    Hypertension    Thoracic aortic aneurysm    Past Surgical History:  Procedure Laterality Date   INCISION AND DRAINAGE PERIRECTAL ABSCESS N/A 02/24/2016   Procedure: IRRIGATION AND DEBRIDEMENT PERIRECTAL ABSCESS AND ANAL FISTULA;  Surgeon: Krystal Russell, MD;  Location: [facility];  Service: General;  Laterality: N/A;   thumb surgery     in high school , hx injury playing football   Current Outpatient Medications on File Prior to Visit  Medication Sig Dispense Refill   aspirin  EC (ASPIRIN  LOW DOSE) 81 MG tablet TAKE 1 TABLET BY MOUTH DAILY. SWALLOW WHOLE. 90 tablet 2   Blood Glucose Monitoring Suppl DEVI 1 each by Does not apply route daily. May substitute to any manufacturer covered by patient's insurance. 1 each 0   Glucose  Blood (BLOOD GLUCOSE TEST STRIPS) STRP Use to check fasting blood sugar daily. May substitute to any manufacturer covered by patient's insurance. 100 each 1   Lancet Device MISC Use to check fasting blood sugar daily. May substitute to any manufacturer covered by patient's insurance. 1 each 0   Lancets Misc. MISC Use to check fasting blood sugars daily. May substitute to any manufacturer  covered by patient's insurance. 100 each 0   lisinopril  (ZESTRIL ) 10 MG tablet TAKE 1 TABLET BY MOUTH EVERY DAY 90 tablet 1   metFORMIN  (GLUCOPHAGE ) 1000 MG tablet TAKE 1 TABLET BY MOUTH TWICE A DAY WITH FOOD 180 tablet 0   simvastatin  (ZOCOR ) 10 MG tablet TAKE 1 TABLET BY MOUTH EVERYDAY AT BEDTIME 90 tablet 1   No current facility-administered medications on file prior to visit.   No Known Allergies Social History   Socioeconomic History   Marital status: Married    Spouse name: Not on file   Number of children: Not on file   Years of education: Not on file   Highest education level: Bachelor's degree (e.g., BA, AB, BS)  Occupational History   Not on file  Tobacco Use   Smoking status: Former    Types: Cigars    Quit date: 01/19/2016    Years since quitting: 7.7   Smokeless tobacco: Never  Substance and Sexual Activity   Alcohol use: Yes    Comment: Social   Drug use: No   Sexual activity: Not on file  Other Topics Concern   Not on file  Social History Narrative   Not on file   Social Drivers of Health   Financial Resource Strain: Low Risk  (11/03/2023)   Overall Financial Resource Strain (CARDIA)    Difficulty of Paying Living Expenses: Not hard at all  Food Insecurity: No Food Insecurity (11/03/2023)   Hunger Vital Sign    Worried About Running Out of Food in the Last Year: Never true    Ran Out of Food in the Last Year: Never true  Transportation Needs: No Transportation Needs (11/03/2023)   PRAPARE - Administrator, Civil Service (Medical): No    Lack of Transportation  (Non-Medical): No  Physical Activity: Insufficiently Active (11/03/2023)   Exercise Vital Sign    Days of Exercise per Week: 3 days    Minutes of Exercise per Session: 40 min  Stress: No Stress Concern Present (11/03/2023)   [employer] of Occupational Health - Occupational Stress Questionnaire    Feeling of Stress: Not at all  Social Connections: Moderately Isolated (11/03/2023)   Social Connection and Isolation Panel    Frequency of Communication with Friends and Family: Once a week    Frequency of Social Gatherings with Friends and Family: Twice a week    Attends Religious Services: Patient declined    Database Administrator or Organizations: No    Attends Engineer, Structural: Not on file    Marital Status: Married  Catering Manager Violence: Not on file     Review of Systems  All other systems reviewed and are negative.      Objective:   Physical Exam Vitals reviewed.  Constitutional:      General: He is not in acute distress.    Appearance: Normal appearance. He is well-developed. He is not ill-appearing, toxic-appearing or diaphoretic.  HENT:     Head: Normocephalic and atraumatic.     Right Ear: Tympanic membrane, ear canal and external ear normal. There is no impacted cerumen.     Left Ear: Tympanic membrane, ear canal and external ear normal. There is no impacted cerumen.     Nose: Nose normal. No congestion or rhinorrhea.     Mouth/Throat:     Mouth: Mucous membranes are moist.     Pharynx: Oropharynx is clear. No oropharyngeal exudate or posterior oropharyngeal erythema.  Eyes:  General: No scleral icterus.       Right eye: No discharge.        Left eye: No discharge.     Extraocular Movements: Extraocular movements intact.     Conjunctiva/sclera: Conjunctivae normal.     Pupils: Pupils are equal, round, and reactive to light.  Neck:     Thyroid: No thyromegaly.     Vascular: No carotid bruit or JVD.  Cardiovascular:     Rate and Rhythm:  Normal rate and regular rhythm.     Pulses: Normal pulses.     Heart sounds: Normal heart sounds. No murmur heard.    No friction rub. No gallop.  Pulmonary:     Effort: Pulmonary effort is normal. No respiratory distress.     Breath sounds: Normal breath sounds. No stridor. No wheezing, rhonchi or rales.  Chest:     Chest wall: No tenderness.  Abdominal:     General: Bowel sounds are normal. There is no distension.     Palpations: Abdomen is soft. There is no mass.     Tenderness: There is no abdominal tenderness. There is no guarding or rebound.  Musculoskeletal:        General: No swelling, tenderness, deformity or signs of injury.     Cervical back: Neck supple. No rigidity.     Right lower leg: No edema.     Left lower leg: No edema.  Lymphadenopathy:     Cervical: No cervical adenopathy.  Skin:    General: Skin is warm.     Coloration: Skin is not jaundiced or pale.     Findings: No bruising, erythema, lesion or rash.  Neurological:     General: No focal deficit present.     Mental Status: He is alert and oriented to person, place, and time. Mental status is at baseline.     Cranial Nerves: No cranial nerve deficit.     Sensory: No sensory deficit.     Motor: No weakness.     Coordination: Coordination normal.     Gait: Gait normal.     Deep Tendon Reflexes: Reflexes normal.  Psychiatric:        Mood and Affect: Mood normal.        Behavior: Behavior normal.        Thought Content: Thought content normal.        Judgment: Judgment normal.           Assessment & Plan:  Prostate cancer screening - Plan: PSA  Type 2 diabetes mellitus without complication, without long-term current use of insulin  (HCC) - Plan: empagliflozin  (JARDIANCE ) 25 MG TABS tablet  Essential hypertension I am extremely happy with his blood work.  His cholesterol is outstanding.  We discussed his coronary artery calcium score and patient elects to discontinue simvastatin .  Hemoglobin A1c is  acceptable at 6.8.  Blood pressure is outstanding.  I will check a PSA today to screen for prostate cancer.  Recommended checking his aortic aneurysm again in 1 year.

## 2023-11-05 ENCOUNTER — Ambulatory Visit: Payer: Self-pay | Admitting: Family Medicine

## 2023-11-05 LAB — PSA: PSA: 1.98 ng/mL (ref <=4.00)

## 2023-11-22 ENCOUNTER — Other Ambulatory Visit: Payer: Self-pay | Admitting: Family Medicine

## 2023-11-22 DIAGNOSIS — E119 Type 2 diabetes mellitus without complications: Secondary | ICD-10-CM

## 2023-11-25 NOTE — Telephone Encounter (Signed)
 Requested medication (s) are due for refill today: yes  Requested medication (s) are on the active medication list: yes  Last refill:  11/22/23  Future visit scheduled: no  Notes to clinic:   Pharmacy comment: Script Clarification:ADD FREQUENCY TO RX FOR DAY SUPPLY.     Requested Prescriptions  Pending Prescriptions Disp Refills   ACCU-CHEK GUIDE TEST test strip [Pharmacy Med Name: ACCU-CHEK GUIDE TEST STRIP] 100 strip 1    Sig: USE AS DIRECTED TO CHECK FASTING BLOOD SUGAR     There is no refill protocol information for this order

## 2023-11-29 ENCOUNTER — Other Ambulatory Visit: Payer: Self-pay

## 2023-12-08 ENCOUNTER — Other Ambulatory Visit: Payer: Self-pay | Admitting: Family Medicine

## 2023-12-08 DIAGNOSIS — E785 Hyperlipidemia, unspecified: Secondary | ICD-10-CM

## 2024-01-12 ENCOUNTER — Other Ambulatory Visit: Payer: Self-pay | Admitting: Family Medicine

## 2024-01-12 DIAGNOSIS — E119 Type 2 diabetes mellitus without complications: Secondary | ICD-10-CM

## 2024-01-16 ENCOUNTER — Other Ambulatory Visit: Payer: Self-pay | Admitting: Family Medicine

## 2024-01-16 DIAGNOSIS — E119 Type 2 diabetes mellitus without complications: Secondary | ICD-10-CM

## 2024-04-27 ENCOUNTER — Other Ambulatory Visit

## 2024-05-04 ENCOUNTER — Encounter: Admitting: Family Medicine
